# Patient Record
Sex: Male | Born: 1955 | Hispanic: No | Marital: Married | Smoking: Former smoker
Health system: Southern US, Community
[De-identification: ages and names within clinical notes are randomized; demographics above are authoritative.]

## PROBLEM LIST (undated history)

## (undated) DIAGNOSIS — E785 Hyperlipidemia, unspecified: Secondary | ICD-10-CM

## (undated) DIAGNOSIS — R05 Cough: Secondary | ICD-10-CM

## (undated) DIAGNOSIS — I10 Essential (primary) hypertension: Secondary | ICD-10-CM

## (undated) DIAGNOSIS — T7840XA Allergy, unspecified, initial encounter: Secondary | ICD-10-CM

## (undated) DIAGNOSIS — J40 Bronchitis, not specified as acute or chronic: Secondary | ICD-10-CM

## (undated) DIAGNOSIS — K648 Other hemorrhoids: Secondary | ICD-10-CM

## (undated) DIAGNOSIS — R059 Cough, unspecified: Secondary | ICD-10-CM

## (undated) DIAGNOSIS — N200 Calculus of kidney: Secondary | ICD-10-CM

## (undated) DIAGNOSIS — K219 Gastro-esophageal reflux disease without esophagitis: Secondary | ICD-10-CM

## (undated) DIAGNOSIS — J209 Acute bronchitis, unspecified: Secondary | ICD-10-CM

## (undated) DIAGNOSIS — D126 Benign neoplasm of colon, unspecified: Secondary | ICD-10-CM

## (undated) DIAGNOSIS — J189 Pneumonia, unspecified organism: Secondary | ICD-10-CM

## (undated) DIAGNOSIS — K5790 Diverticulosis of intestine, part unspecified, without perforation or abscess without bleeding: Secondary | ICD-10-CM

## (undated) DIAGNOSIS — Z973 Presence of spectacles and contact lenses: Secondary | ICD-10-CM

## (undated) HISTORY — DX: Gastro-esophageal reflux disease without esophagitis: K21.9

## (undated) HISTORY — DX: Acute bronchitis, unspecified: J20.9

## (undated) HISTORY — DX: Bronchitis, not specified as acute or chronic: J40

## (undated) HISTORY — DX: Presence of spectacles and contact lenses: Z97.3

## (undated) HISTORY — DX: Hyperlipidemia, unspecified: E78.5

## (undated) HISTORY — PX: LUNG REMOVAL, PARTIAL: SHX233

## (undated) HISTORY — DX: Pneumonia, unspecified organism: J18.9

## (undated) HISTORY — DX: Other hemorrhoids: K64.8

## (undated) HISTORY — DX: Cough: R05

## (undated) HISTORY — DX: Calculus of kidney: N20.0

## (undated) HISTORY — DX: Benign neoplasm of colon, unspecified: D12.6

## (undated) HISTORY — DX: Diverticulosis of intestine, part unspecified, without perforation or abscess without bleeding: K57.90

## (undated) HISTORY — DX: Allergy, unspecified, initial encounter: T78.40XA

## (undated) HISTORY — DX: Cough, unspecified: R05.9

---

## 1993-10-22 HISTORY — PX: LUNG REMOVAL, PARTIAL: SHX233

## 1998-09-01 ENCOUNTER — Ambulatory Visit (HOSPITAL_COMMUNITY): Admission: RE | Admit: 1998-09-01 | Discharge: 1998-09-01 | Payer: Self-pay | Admitting: Gastroenterology

## 1998-09-03 ENCOUNTER — Emergency Department (HOSPITAL_COMMUNITY): Admission: EM | Admit: 1998-09-03 | Discharge: 1998-09-03 | Payer: Self-pay

## 1999-08-10 ENCOUNTER — Emergency Department (HOSPITAL_COMMUNITY): Admission: EM | Admit: 1999-08-10 | Discharge: 1999-08-10 | Payer: Self-pay | Admitting: *Deleted

## 2003-08-11 ENCOUNTER — Emergency Department (HOSPITAL_COMMUNITY): Admission: EM | Admit: 2003-08-11 | Discharge: 2003-08-12 | Payer: Self-pay | Admitting: Emergency Medicine

## 2010-02-07 ENCOUNTER — Encounter: Admission: RE | Admit: 2010-02-07 | Discharge: 2010-02-07 | Payer: Self-pay | Admitting: Specialist

## 2016-02-23 ENCOUNTER — Encounter: Payer: Self-pay | Admitting: Pulmonary Disease

## 2016-02-23 ENCOUNTER — Ambulatory Visit (INDEPENDENT_AMBULATORY_CARE_PROVIDER_SITE_OTHER): Payer: BLUE CROSS/BLUE SHIELD | Admitting: Pulmonary Disease

## 2016-02-23 VITALS — BP 160/90 | HR 78 | Ht 64.0 in | Wt 163.8 lb

## 2016-02-23 DIAGNOSIS — R0789 Other chest pain: Secondary | ICD-10-CM | POA: Diagnosis not present

## 2016-02-23 DIAGNOSIS — R059 Cough, unspecified: Secondary | ICD-10-CM

## 2016-02-23 DIAGNOSIS — R05 Cough: Secondary | ICD-10-CM | POA: Diagnosis not present

## 2016-02-23 NOTE — Patient Instructions (Signed)
   Call me if your cough returns  We will do a breathing test at her next appointment  I will see back in 4-6 weeks or sooner if needed  TESTS ORDERED: 1. Full PFT at f/u appointment

## 2016-02-23 NOTE — Progress Notes (Signed)
Subjective:    Patient ID: Garrett Henry, male    DOB: [birth year], 61 y.o.   MRN: [id number]  HPI He reports he was seen in April by his PCP for persistent coughing. He reports previously he was diagnosed with pneumonia and was treated with antibiotics. He underwent left lung surgery in 1995. He had resection for a nodule. He reports acute bronchitis generally once yearly in the early Spring. He reports his cough has improved significantly. He reports an abnormal sensation and pain in his left chest that he describes as a pain in his left chest radiating to his back under his scapula. His cough is nonproductive. The cough & chest discomfort are precipitated by lifting heavy objection. Denies any wheezing. Denies any dyspnea. He denies any recent fever, chills, or sweats. Previously he was having fever and sweats in April. Denies any sinus congestion or pressure. No rashes or bruising.   Review of Systems No nausea, emesis or diarrhea. No reflux. No adenopathy in his neck, groin, or axilla. A pertinent 14 point review of systems is negative except as per the history of presenting illness.  Allergies  Allergen Reactions  . Amoxicillin     Pressure in face    No current outpatient prescriptions on file prior to visit.   No current facility-administered medications on file prior to visit.    Past Medical History  Diagnosis Date  . Acute bronchitis   . PNA (pneumonia)   . Cough   . Bronchitis     Past Surgical History  Procedure Laterality Date  . Left lobectomy      Family History  Problem Relation Age of Onset  . Stroke Father     Social History   Social History  . Marital Status: Single    Spouse Name: N/A  . Number of Children: N/A  . Years of Education: N/A   Social History Main Topics  . Smoking status: Former Smoker -- 0.50 packs/day for 20 years    Types: Cigarettes    Quit date: 10/22/1993  . Smokeless tobacco: None     Comment: smoked tobacco until 1992, then  started smoking cigerettes.  . Alcohol Use: No  . Drug Use: No  . Sexual Activity: Not Asked   Other Topics Concern  . None   Social History Narrative   Originally from [place]. He moved to [place] from [place] in 1992. Currently doesn't work and takes care of his granddaughter. No pets currently. No bird exposure. No mold exposure. Previously did live in the [place] in [place].       Objective:   Physical Exam BP 160/90 mmHg  Pulse 78  Ht 5\' 4"  (1.626 m)  Wt 163 lb 12.8 oz (74.299 kg)  BMI 28.10 kg/m2  SpO2 97% General:  Awake. Alert. No acute distress.  Integument:  Warm & dry. No rash on exposed skin. No bruising. Tattoo noted on left forearm. Lymphatics:  No appreciated cervical or supraclavicular lymphadenoapthy. HEENT:  Moist mucus membranes. No oral ulcers. No scleral injection or icterus. Moderate bilateral nasal turbinate swelling. Cardiovascular:  Regular rate. No edema. No appreciable JVD.  Pulmonary:  Good aeration & clear to auscultation bilaterally. Symmetric chest wall expansion. No accessory muscle use. Abdomen: Soft. Normal bowel sounds. Nondistended. Grossly nontender. Musculoskeletal:  Normal bulk and tone. Hand grip strength 5/5 bilaterally. No joint deformity or effusion appreciated. Neurological:  CN 2-12 grossly in tact. No meningismus. Moving all 4 extremities equally. Symmetric brachioradialis deep tendon reflexes. Psychiatric:  Mood and affect congruent. Speech normal rhythm, rate & tone.   IMAGING CXR PA/LAT 02/07/10 (personally reviewed by me):  Linear opacity left upper lobe consistent with scar formation. Flattening of the left hemidiaphragm. No other focal opacity appreciated. No pleural effusion. Heart normal in size. Mediastinum normal in contour.    Assessment & Plan:  60 year old Asian male with history of left lung resection for nodule in 1995. Patient does have a history of tobacco use as well. Seems to have atypical pain that is more of a pleuritic  nature and given the left side predominance represents previous scar tissue formation. As to why this is affecting him more now so many years after his original surgery is unclear. We did discuss performing CT imaging of the chest to get a better evaluation of his lung and surrounding tissues but he feels he is not in a financial position at this time to be able to afford the test. Given his history of coughing I do feel it's reasonable to evaluate him for possible underlying COPD. I instructed the patient to contact my office if his cough returns or he develops any new difficulties with his breathing before his next appointment.  1. Cough: Checking full pulmonary function testing 2. Atypical chest pain: Patient declined CT imaging of the chest at this time. 3. Follow-up: Patient to return to clinic in 4-6 weeks or sooner if needed.  Sonia Baller Ashok Cordia, M.D. [facility] Pulmonary & Critical Care Pager:  [phone] After 3pm or if no response, call [phone] 4:00 PM 02/23/2016

## 2016-05-23 ENCOUNTER — Ambulatory Visit (INDEPENDENT_AMBULATORY_CARE_PROVIDER_SITE_OTHER): Payer: BLUE CROSS/BLUE SHIELD | Admitting: Pulmonary Disease

## 2016-05-23 ENCOUNTER — Encounter: Payer: Self-pay | Admitting: Pulmonary Disease

## 2016-05-23 VITALS — BP 132/74 | HR 56 | Ht 64.75 in | Wt 161.0 lb

## 2016-05-23 DIAGNOSIS — J984 Other disorders of lung: Secondary | ICD-10-CM | POA: Diagnosis not present

## 2016-05-23 DIAGNOSIS — K219 Gastro-esophageal reflux disease without esophagitis: Secondary | ICD-10-CM | POA: Insufficient documentation

## 2016-05-23 DIAGNOSIS — R059 Cough, unspecified: Secondary | ICD-10-CM

## 2016-05-23 DIAGNOSIS — R05 Cough: Secondary | ICD-10-CM | POA: Diagnosis not present

## 2016-05-23 DIAGNOSIS — R0789 Other chest pain: Secondary | ICD-10-CM | POA: Diagnosis not present

## 2016-05-23 LAB — PULMONARY FUNCTION TEST
DL/VA % pred: 170 %
DL/VA: 7.28 ml/min/mmHg/L
DLCO cor % pred: 111 %
DLCO cor: 28.16 ml/min/mmHg
DLCO unc % pred: 107 %
DLCO unc: 27.24 ml/min/mmHg
FEF 25-75 Post: 2.19 L/sec
FEF 25-75 Pre: 1.88 L/sec
FEF2575-%Change-Post: 16 %
FEF2575-%Pred-Post: 86 %
FEF2575-%Pred-Pre: 74 %
FEV1-%Change-Post: 3 %
FEV1-%Pred-Post: 83 %
FEV1-%Pred-Pre: 80 %
FEV1-Post: 2.37 L
FEV1-Pre: 2.29 L
FEV1FVC-%Change-Post: 2 %
FEV1FVC-%Pred-Pre: 97 %
FEV6-%Change-Post: 1 %
FEV6-Post: 2.98 L
FEV6-Pre: 2.95 L
FEV6FVC-%Change-Post: 0 %
FVC-%Change-Post: 0 %
FVC-%Pred-Post: 83 %
FVC-%Pred-Pre: 83 %
FVC-Post: 2.98 L
FVC-Pre: 2.96 L
Post FEV1/FVC ratio: 79 %
Post FEV6/FVC ratio: 100 %
Pre FEV1/FVC ratio: 77 %
Pre FEV6/FVC Ratio: 100 %
RV % pred: 57 %
RV: 1.12 L
TLC % pred: 68 %
TLC: 4.04 L

## 2016-05-23 NOTE — Progress Notes (Signed)
Subjective:    Patient ID: Garrett Henry, male    DOB: 1956-05-15, 60 y.o.   MRN: [id number]  C.C.:  Follow-up for Cough & Atypical Chest Pain.  HPI Cough:  He reports his cough has resolved. Denies any dyspnea.   Aypyical Chest Pain:  Patient declined CT scan at last appointment. He reports his pain is intermittent and he feels pain with continuing to lift heavy objects. He reports the pain is in his right chest radiating to his back.  Review of Systems he reports about 2 nights ago he did have sweats. No other subjective fever or chills. He has had some ongoing abdominal discomfort. Denies any nausea or emesis. Does have reflux & dyspepsia with spicy foods, pizza, & soda. No rashes or bruising.   Allergies  Allergen Reactions  . Amoxicillin     Pressure in face    No current outpatient prescriptions on file prior to visit.   No current facility-administered medications on file prior to visit.     Past Medical History:  Diagnosis Date  . Acute bronchitis   . Bronchitis   . Cough   . PNA (pneumonia)     Past Surgical History:  Procedure Laterality Date  . left lobectomy      Family History  Problem Relation Age of Onset  . Stroke Father     Social History   Social History  . Marital status: Single    Spouse name: N/A  . Number of children: N/A  . Years of education: N/A   Social History Main Topics  . Smoking status: Former Smoker    Packs/day: 0.50    Years: 20.00    Types: Cigarettes    Quit date: 10/22/1993  . Smokeless tobacco: None     Comment: smoked tobacco until 1992, then started smoking cigerettes.  . Alcohol use No  . Drug use: No  . Sexual activity: Not Asked   Other Topics Concern  . None   Social History Narrative   Originally from [place]. He moved to [place] from [place] in 1992. Currently doesn't work and takes care of his granddaughter. No pets currently. No bird exposure. No mold exposure. Previously did live in the jungle in [place].      Objective:   Physical Exam BP 132/74 (BP Location: Left Arm, Cuff Size: Normal)   Pulse (!) 56   Ht 5' 4.75" (1.645 m)   Wt 161 lb (73 kg)   SpO2 99%   BMI 27.00 kg/m  General:  Awake. No distress. Appears comfortable. Integument:  Warm & dry. No rash on exposed skin. Tattoo noted on left forearm. Lymphatics:  No appreciated cervical or supraclavicular lymphadenoapthy. HEENT:  Moist mucus membranes. No oral ulcers. Minimal nasal turbinate swelling. Cardiovascular:  Regular rate. No edema. Normal S1 & S2.  Pulmonary:  Normal work of breathing on room air. Clear bilaterally to auscultation.Good aeration bilaterally.  Abdomen: Soft. Normal bowel sounds. Nontender. Musculoskeletal: No chest wall deformity. No palpation of anterior or posterior chest.  PFT 05/23/10: FVC 2.96 L (83%) FEV1 2.29 L (80%) FEV1/FVC 0.77 FEF 25-75 1.88 L (74%) no bronchodilator response TLC 4.04 L (68%) RV 57% ERV 120% DLCO corrected 111% (Hgb 13.5)  IMAGING CXR PA/LAT 02/07/10 (previously reviewed by me):  Linear opacity left upper lobe consistent with scar formation. Flattening of the left hemidiaphragm. No other focal opacity appreciated. No pleural effusion. Heart normal in size. Mediastinum normal in contour.    Assessment & Plan:  60  y.o. Asian male with history of left lung resection for nodule in 1995. Mild restriction noted on lung volumes today with normal spirometry and carbon monoxide diffusion capacity is likely due to his prior left lung resection. I did spend a significant amount time today counseling the patient on the high potential for a new lung malignancy given his previous history of tobacco use. However, the patient declines chest imaging at this time. He prefers to contact my office if he decides to undergo any chest imaging in the future. I instructed him to contact me if he had any further questions or concerns or if I can be of any further assistance to him.  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 Patient declines further chest imaging at this time.  2. Atypical Chest Pain: Patient declines further chest imaging at this time. 3. Follow-up: Patient will return to clinic on an as-needed basis.   Sonia Baller Ashok Cordia, M.D. [facility] Pulmonary & Critical Care Pager:  [phone] After 3pm or if no response, call [phone] 11:37 AM 05/23/16

## 2016-05-23 NOTE — Patient Instructions (Signed)
   Call me if your pain doesn't go away or you decide you want to get an X-ray of your chest.  Just call my office to set up an appointment if you need to be seen again.

## 2016-05-23 NOTE — Progress Notes (Signed)
Test reviewed.  

## 2016-11-28 ENCOUNTER — Encounter: Payer: Self-pay | Admitting: Pulmonary Disease

## 2016-11-28 ENCOUNTER — Ambulatory Visit (INDEPENDENT_AMBULATORY_CARE_PROVIDER_SITE_OTHER): Payer: BLUE CROSS/BLUE SHIELD | Admitting: Pulmonary Disease

## 2016-11-28 ENCOUNTER — Ambulatory Visit: Payer: BLUE CROSS/BLUE SHIELD | Admitting: Pulmonary Disease

## 2016-11-28 ENCOUNTER — Ambulatory Visit (INDEPENDENT_AMBULATORY_CARE_PROVIDER_SITE_OTHER)
Admission: RE | Admit: 2016-11-28 | Discharge: 2016-11-28 | Disposition: A | Discharge status: Home / Self Care | Payer: BLUE CROSS/BLUE SHIELD | Source: Ambulatory Visit | Attending: Pulmonary Disease | Admitting: Pulmonary Disease

## 2016-11-28 VITALS — BP 130/82 | HR 69 | Ht 64.75 in | Wt 166.0 lb

## 2016-11-28 DIAGNOSIS — R0789 Other chest pain: Secondary | ICD-10-CM | POA: Diagnosis not present

## 2016-11-28 DIAGNOSIS — R05 Cough: Secondary | ICD-10-CM

## 2016-11-28 DIAGNOSIS — R059 Cough, unspecified: Secondary | ICD-10-CM

## 2016-11-28 MED ORDER — PREDNISONE 10 MG PO TABS: 60.0000 mg | ORAL_TABLET | Freq: Every day | ORAL | 0 refills | Status: AC

## 2016-11-28 NOTE — Progress Notes (Signed)
Subjective:    Patient ID: Garrett Henry, male    DOB: 23-Aug-1956, 61 y.o.   MRN: [id number]  C.C.:  Follow-up for Cough & Atypical Chest Pain.  HPI Cough:  Cough previously had resolved. He reports he still has mild dyspnea. He now reports his cough is only with pain.   Aypyical Chest Pain:  Patient previously underwent lung resection. Previously declined further imaging. Previously described pain with lifting heavy objects and with right chest radiating to his back being the primary descriptors. He still has pain with lifting objects. Denies any pleurisy.  Review of Systems  He reports a subjective fever with his pain. No other chills or sweats. No abdominal pain or nausea. No rashes or bruising.   Allergies  Allergen Reactions  . Amoxicillin     Pressure in face    No current outpatient prescriptions on file prior to visit.   No current facility-administered medications on file prior to visit.     Past Medical History:  Diagnosis Date  . Acute bronchitis   . Bronchitis   . Cough   . PNA (pneumonia)     Past Surgical History:  Procedure Laterality Date  . left lobectomy      Family History  Problem Relation Age of Onset  . Stroke Father     Social History   Social History  . Marital status: Single    Spouse name: N/A  . Number of children: N/A  . Years of education: N/A   Social History Main Topics  . Smoking status: Former Smoker    Packs/day: 0.50    Years: 20.00    Types: Cigarettes    Quit date: 10/22/1993  . Smokeless tobacco: Never Used     Comment: smoked tobacco until 1992, then started smoking cigerettes.  . Alcohol use No  . Drug use: No  . Sexual activity: Not Asked   Other Topics Concern  . None   Social History Narrative   Originally from [place]. He moved to [place] from [place] in 1992. Currently doesn't work and takes care of his granddaughter. No pets currently. No bird exposure. No mold exposure. Previously did live in the jungle in  [place].       Objective:   Physical Exam BP 130/82 (BP Location: Right Arm, Patient Position: Sitting, Cuff Size: Normal)   Pulse 69   Ht 5' 4.75" (1.645 m)   Wt 166 lb (75.3 kg)   SpO2 95%   BMI 27.84 kg/m   General:  Awake. Alert. No acute distress.  Integument:  Warm & dry. No rash on exposed skin.  Extremities:  No cyanosis or clubbing.  HEENT:  Moist mucus membranes. No oral ulcers. No scleral injection or icterus.  Cardiovascular:  Regular rate. No edema. S1 & S2.  Pulmonary:  Good aeration & clear to auscultation bilaterally. Symmetric chest wall expansion. No accessory muscle use on room air. Abdomen: Soft. Normal bowel sounds. Nondistended.  Musculoskeletal:  Normal bulk and tone. No joint deformity or effusion appreciated. No pain with palpation of his chest. No chest wall deformity.  PFT 05/23/10: FVC 2.96 L (83%) FEV1 2.29 L (80%) FEV1/FVC 0.77 FEF 25-75 1.88 L (74%) no bronchodilator response TLC 4.04 L (68%) RV 57% ERV 120% DLCO corrected 111% (Hgb 13.5)  IMAGING CXR PA/LAT 02/07/10 (previously reviewed by me):  Linear opacity left upper lobe consistent with scar formation. Flattening of the left hemidiaphragm. No other focal opacity appreciated. No pleural effusion. Heart normal in size.  Mediastinum normal in contour.    Assessment & Plan:  61 y.o. Asian male with known history of atypical chest pain and restrictive lung disease likely due to prior resection. He continues to have an intermittent nonproductive cough. He also is continuing to have atypical chest pain which is likely due to his previous lung resection. Given the potential for symptomatic relief from anti-inflammatory medications in the past I believe trying prednisone is reasonable. I instructed the patient, office if he had any new breathing problems or questions before his next appointment.  1. Cough: Treating with prednisone 60 mg daily 5 days. Checking chest x-ray PA/LAT today. 2. Atypical Chest Pain:  Possibly secondary to previous lung resection. Checking CT chest without contrast as soon as possible.  3. Follow-up:  Return to clinic in 3 weeks or sooner if needed.   Sonia Baller Ashok Cordia, M.D. [facility] Pulmonary & Critical Care Pager:  [phone] After 3pm or if no response, call [phone] 12:16 PM 11/28/16

## 2016-11-28 NOTE — Patient Instructions (Addendum)
   We will review your test results from your X-ray and CT at your next appointment.  Let me know if your pain doesn't get better with the Prednisone I'm prescribing today.  I will see you back in 3 weeks.  TESTS ORDERED: 1. CXR PA/LAT TODAY 2. CT CHEST W/O ASAP

## 2016-12-05 ENCOUNTER — Ambulatory Visit (INDEPENDENT_AMBULATORY_CARE_PROVIDER_SITE_OTHER)
Admission: RE | Admit: 2016-12-05 | Discharge: 2016-12-05 | Disposition: A | Discharge status: Home / Self Care | Payer: BLUE CROSS/BLUE SHIELD | Source: Ambulatory Visit | Attending: Pulmonary Disease | Admitting: Pulmonary Disease

## 2016-12-05 DIAGNOSIS — R05 Cough: Secondary | ICD-10-CM | POA: Diagnosis not present

## 2016-12-05 DIAGNOSIS — R059 Cough, unspecified: Secondary | ICD-10-CM

## 2016-12-14 ENCOUNTER — Telehealth: Payer: Self-pay | Admitting: Pulmonary Disease

## 2016-12-17 NOTE — Telephone Encounter (Signed)
Notes Recorded by Javier Glazier, MD on 12/14/2016 at 2:30 PM EST Please let the patient know I reviewed his chest CT scan. There is some scar tissue surrounding his left upper lung from his previous surgery. But no other cause for his symptoms. Thank you. ------------------------------------------- Spoke with pt. He is aware of results. Nothing further was needed.

## 2016-12-24 ENCOUNTER — Encounter: Payer: Self-pay | Admitting: Pulmonary Disease

## 2016-12-24 ENCOUNTER — Ambulatory Visit (INDEPENDENT_AMBULATORY_CARE_PROVIDER_SITE_OTHER): Payer: BLUE CROSS/BLUE SHIELD | Admitting: Pulmonary Disease

## 2016-12-24 ENCOUNTER — Ambulatory Visit: Payer: BLUE CROSS/BLUE SHIELD | Admitting: Pulmonary Disease

## 2016-12-24 VITALS — BP 130/72 | HR 68 | Ht 64.75 in | Wt 165.0 lb

## 2016-12-24 DIAGNOSIS — R05 Cough: Secondary | ICD-10-CM

## 2016-12-24 DIAGNOSIS — R059 Cough, unspecified: Secondary | ICD-10-CM

## 2016-12-24 DIAGNOSIS — R0789 Other chest pain: Secondary | ICD-10-CM | POA: Diagnosis not present

## 2016-12-24 NOTE — Progress Notes (Signed)
Subjective:    Patient ID: Garrett Henry, male    DOB: 04/12/1956, 61 y.o.   MRN: [id number]  C.C.:  Follow-up for Cough & Atypical Chest Pain.  HPI Cough: Patient given prednisone prescription at last appointment but stopped abruptly after only one tablet with increased cough and dyspnea. Cough is still with exertion. But overall cough is significantly improved.  Atypical chest pain: He reports he no more chest pain or pressure. Reports only minimal discomfort depending upon lifting and torsion of torso.  Review of Systems  He denies any dyspnea at this moment. He continues to have dyspnea on exertion. No subjective fever or chills. No abdominal pain or nausea.  Allergies  Allergen Reactions  . Amoxicillin     Pressure in face    No current outpatient prescriptions on file prior to visit.   No current facility-administered medications on file prior to visit.     Past Medical History:  Diagnosis Date  . Acute bronchitis   . Bronchitis   . Cough   . PNA (pneumonia)     Past Surgical History:  Procedure Laterality Date  . left lobectomy      Family History  Problem Relation Age of Onset  . Stroke Father     Social History   Social History  . Marital status: Single    Spouse name: N/A  . Number of children: N/A  . Years of education: N/A   Social History Main Topics  . Smoking status: Former Smoker    Packs/day: 0.50    Years: 20.00    Types: Cigarettes    Quit date: 10/22/1993  . Smokeless tobacco: Never Used     Comment: smoked tobacco until 1992, then started smoking cigerettes.  . Alcohol use No  . Drug use: No  . Sexual activity: Not Asked   Other Topics Concern  . None   Social History Narrative   Originally from [place]. He moved to [place] from [place] in 1992. Currently doesn't work and takes care of his granddaughter. No pets currently. No bird exposure. No mold exposure. Previously did live in the jungle in [place].       Objective:   Physical  Exam BP 130/72 (BP Location: Left Arm, Patient Position: Sitting, Cuff Size: Large)   Pulse 68   Ht 5' 4.75" (1.645 m)   Wt 165 lb (74.8 kg)   SpO2 97%   BMI 27.67 kg/m   Gen.: Awake. No distress. Alert. Integument: Nondiaphoretic. No rash or bruising on exposed skin. Extremities: No cyanosis or clubbing. HEENT: Moist mucous membranes. No scleral icterus or injection. Pulmonary: Normal work of breathing on room air. Speaking in complete sentences. Musculoskeletal: Normal bulk and tone. No joint deformity or effusion appreciated.  PFT 05/23/10: FVC 2.96 L (83%) FEV1 2.29 L (80%) FEV1/FVC 0.77 FEF 25-75 1.88 L (74%) no bronchodilator response TLC 4.04 L (68%) RV 57% ERV 120% DLCO corrected 111% (Hgb 13.5)  IMAGING CT CHEST W/O 12/05/16 (personally reviewed by me):  Postoperative scar tissue formation in the left upper lobe unchanged. Calcified pleural plaque at the right lung base. No other new parenchymal opacity or mass appreciated. No pleural effusion. No pathologic mediastinal adenopathy. No pericardial effusion.  CXR PA/LAT 02/07/10 (previously reviewed by me):  Linear opacity left upper lobe consistent with scar formation. Flattening of the left hemidiaphragm. No other focal opacity appreciated. No pleural effusion. Heart normal in size. Mediastinum normal in contour.    Assessment & Plan:  61 y.o.  male with known history of atypical chest pain and cough. Has restrictive lung disease secondary to his prior lung resection and scar tissue formation.  I reviewed his chest CT scan with him today which does show scar tissue formation in his left upper lung as well as his left pleura which is likely due to his prior lung resection. With improvement in his cough and atypical chest pain I do not feel further medications are necessary but did urge him to follow-up with his primary care physician as he was previously prescribed a medication which significantly helped, again should this recur. I  explained to the patient that the scar tissue formation was from his previous surgery and to be expected. I instructed the patient to contact my office if he had any new breathing problems or questions as I would be happy to see him back.  1. Cough: Resolving. Likely due to scar tissue formation. Recommended obtaining prescription from his PCP which previously help to completely resolve his cough. 2. Atypical chest pain: Resolving. No new medications. 3. Follow-up: Return to clinic as needed.  Sonia Baller Ashok Cordia, M.D. [facility] Pulmonary & Critical Care Pager:  [phone] After 3pm or if no response, call [phone] 4:34 PM 12/24/16

## 2016-12-24 NOTE — Patient Instructions (Signed)
   Call me if you have any new breathing problems or questions.  I will see you back as needed.  

## 2018-03-12 ENCOUNTER — Encounter: Payer: Self-pay | Admitting: Internal Medicine

## 2018-04-25 ENCOUNTER — Encounter: Payer: Self-pay | Admitting: *Deleted

## 2018-05-19 ENCOUNTER — Encounter (INDEPENDENT_AMBULATORY_CARE_PROVIDER_SITE_OTHER): Payer: Self-pay

## 2018-05-19 ENCOUNTER — Other Ambulatory Visit (INDEPENDENT_AMBULATORY_CARE_PROVIDER_SITE_OTHER): Payer: BLUE CROSS/BLUE SHIELD

## 2018-05-19 ENCOUNTER — Encounter: Payer: Self-pay | Admitting: Internal Medicine

## 2018-05-19 ENCOUNTER — Ambulatory Visit: Payer: BLUE CROSS/BLUE SHIELD | Admitting: Internal Medicine

## 2018-05-19 VITALS — BP 136/66 | HR 72 | Ht 64.0 in | Wt 165.1 lb

## 2018-05-19 DIAGNOSIS — R150 Incomplete defecation: Secondary | ICD-10-CM

## 2018-05-19 DIAGNOSIS — K5900 Constipation, unspecified: Secondary | ICD-10-CM | POA: Diagnosis not present

## 2018-05-19 DIAGNOSIS — Z1211 Encounter for screening for malignant neoplasm of colon: Secondary | ICD-10-CM

## 2018-05-19 LAB — CBC WITH DIFFERENTIAL/PLATELET
Basophils Absolute: 0 10*3/uL (ref 0.0–0.1)
Basophils Relative: 0.3 % (ref 0.0–3.0)
EOS ABS: 0.1 10*3/uL (ref 0.0–0.7)
EOS PCT: 0.8 % (ref 0.0–5.0)
HCT: 47.4 % (ref 39.0–52.0)
Hemoglobin: 15 g/dL (ref 13.0–17.0)
LYMPHS PCT: 27.2 % (ref 12.0–46.0)
Lymphs Abs: 2 10*3/uL (ref 0.7–4.0)
MCHC: 31.8 g/dL (ref 30.0–36.0)
MCV: 77.2 fl — ABNORMAL LOW (ref 78.0–100.0)
MONO ABS: 0.7 10*3/uL (ref 0.1–1.0)
Monocytes Relative: 9.1 % (ref 3.0–12.0)
Neutro Abs: 4.7 10*3/uL (ref 1.4–7.7)
Neutrophils Relative %: 62.6 % (ref 43.0–77.0)
Platelets: 163 10*3/uL (ref 150.0–400.0)
RBC: 6.14 Mil/uL — AB (ref 4.22–5.81)
RDW: 13.3 % (ref 11.5–15.5)
WBC: 7.5 10*3/uL (ref 4.0–10.5)

## 2018-05-19 LAB — FERRITIN: FERRITIN: 374.6 ng/mL — AB (ref 22.0–322.0)

## 2018-05-19 MED ORDER — SUPREP BOWEL PREP KIT 17.5-3.13-1.6 GM/177ML PO SOLN: 1.0000 | ORAL | 0 refills | Status: DC

## 2018-05-19 NOTE — Patient Instructions (Signed)
You have been scheduled for a colonoscopy. Please follow written instructions given to you at your visit today.  Please pick up your prep supplies at the pharmacy within the next 1-3 days. If you use inhalers (even only as needed), please bring them with you on the day of your procedure. Your physician has requested that you go to www.startemmi.com and enter the access code given to you at your visit today. This web site gives a general overview about your procedure. However, you should still follow specific instructions given to you by our office regarding your preparation for the procedure.  Your provider has requested that you go to the basement level for lab work before leaving today. Press "B" on the elevator. The lab is located at the first door on the left as you exit the elevator.  If you are age 37 or older, your body mass index should be between 23-30. Your Body mass index is 28.34 kg/m. If this is out of the aforementioned range listed, please consider follow up with your Primary Care Provider.  If you are age 64 or younger, your body mass index should be between 19-25. Your Body mass index is 28.34 kg/m. If this is out of the aformentioned range listed, please consider follow up with your Primary Care Provider.

## 2018-05-19 NOTE — Progress Notes (Signed)
Patient ID: Garrett Henry, male   DOB: 03-Nov-1955, 62 y.o.   MRN: [id number] HPI: Garrett Henry is a 62 year old male seen in consultation at the request of Leonides Grills, Jr., PA-C to evaluate constipation and also for colon cancer screening.  He is here alone today.  He reports that over the last several months he has noticed more difficulty with bowel movements.  Stool can be hard and difficult at times to pass.  He feels incomplete defecation.  He states most often he will have a normal stool in the morning but then after that have the urge for more bowel movement but be unable to produce any more stool.  Feels like bowels are incompletely evacuated.  Can be associated with lower back pain.  Has not seen blood in his stool or melena.  At times feels a burning abdominal pain which seems to be better with ginger.  He has not lost weight.  In the past was told he may have acid reflux and try to medication at night but did not find it helpful.  He denies dysphagia and odynophagia.  Does not know family medical history as he is from [place] and has been separated from his family for many years.  On review of records it appears that he may have been prescribed oral iron.  He is not taking any medications currently.  He has never had a colonoscopy  Past Medical History:  Diagnosis Date  . Acute bronchitis   . Bronchitis   . Cough   . PNA (pneumonia)     Past Surgical History:  Procedure Laterality Date  . LUNG REMOVAL, PARTIAL Left     No outpatient medications prior to visit.   No facility-administered medications prior to visit.     Allergies  Allergen Reactions  . Amoxicillin     Pressure in face    Family History  Problem Relation Age of Onset  . Stroke Father     Social History   Tobacco Use  . Smoking status: Former Smoker    Packs/day: 0.50    Years: 20.00    Pack years: 10.00    Types: Cigarettes    Last attempt to quit: 10/22/1993    Years since quitting: 24.5  .  Smokeless tobacco: Never Used  . Tobacco comment: smoked tobacco until 1992, then started smoking cigerettes.  Substance Use Topics  . Alcohol use: No    Alcohol/week: 0.0 oz  . Drug use: No    ROS: As per history of present illness, otherwise negative  BP 136/66 (BP Location: Left Arm, Patient Position: Sitting, Cuff Size: Normal)   Pulse 72   Ht 5\' 4"  (1.626 m) Comment: height measured without shoes  Wt 165 lb 2 oz (74.9 kg)   BMI 28.34 kg/m  Constitutional: Well-developed and well-nourished. No distress. HEENT: Normocephalic and atraumatic. Conjunctivae are normal.  No scleral icterus. Neck: Neck supple. Trachea midline. Cardiovascular: Normal rate, regular rhythm and intact distal pulses. No M/R/G Pulmonary/chest: Effort normal and breath sounds normal. No wheezing, rales or rhonchi. Abdominal: Soft, nontender, nondistended. Bowel sounds active throughout. There are no masses palpable. No hepatosplenomegaly. Extremities: no clubbing, cyanosis, or edema Neurological: Alert and oriented to person place and time. Skin: Skin is warm and dry.  Psychiatric: Normal mood and affect. Behavior is normal.  ASSESSMENT/PLAN: 62 year old male seen in consultation at the request of Leonides Grills, Jr., PA-C to evaluate constipation and also for colon cancer screening.    1.  Colon cancer  screening --I recommended we proceed to colonoscopy for colon cancer screening.  This will be his initial colonoscopy.  We discussed the risk, benefits and alternatives and he is agreeable and wishes to proceed.  2.  Constipation/incomplete evacuation --I am proceeding with colonoscopy as above.   if normal we can try a laxative such as MiraLAX.  We will address this issue after colonoscopy.  3.  Question iron deficiency --noted in his chart though I do not see prior labs in our system.  I am going to check a CBC and ferritin today.    TD:VVOH, Side M., Pa-c [address] [place], [place] [address]

## 2018-06-24 ENCOUNTER — Encounter: Payer: Self-pay | Admitting: Internal Medicine

## 2018-07-07 ENCOUNTER — Ambulatory Visit (AMBULATORY_SURGERY_CENTER): Payer: BLUE CROSS/BLUE SHIELD | Admitting: Internal Medicine

## 2018-07-07 ENCOUNTER — Encounter: Payer: Self-pay | Admitting: Internal Medicine

## 2018-07-07 VITALS — BP 121/64 | HR 74 | Temp 98.6°F | Resp 29 | Ht 64.0 in | Wt 165.0 lb

## 2018-07-07 DIAGNOSIS — D123 Benign neoplasm of transverse colon: Secondary | ICD-10-CM

## 2018-07-07 DIAGNOSIS — Z1211 Encounter for screening for malignant neoplasm of colon: Secondary | ICD-10-CM

## 2018-07-07 MED ORDER — SODIUM CHLORIDE 0.9 % IV SOLN: 500.0000 mL | Freq: Once | INTRAVENOUS | Status: DC

## 2018-07-07 NOTE — Op Note (Signed)
[facility] Patient Name: Garrett Henry Procedure Date: 07/07/2018 2:50 PM MRN: [id number] Endoscopist: Jerene Bears , MD Age: 62 Referring MD:  Date of Birth: 10-16-1956 Gender: Male Account #: [id number] Procedure:                Colonoscopy Indications:              Screening for colorectal malignant neoplasm, This                            is the patient's first colonoscopy Medicines:                Monitored Anesthesia Care Procedure:                Pre-Anesthesia Assessment:                           - Prior to the procedure, a History and Physical                            was performed, and patient medications and                            allergies were reviewed. The patient's tolerance of                            previous anesthesia was also reviewed. The risks                            and benefits of the procedure and the sedation                            options and risks were discussed with the patient.                            All questions were answered, and informed consent                            was obtained. Prior Anticoagulants: The patient has                            taken no previous anticoagulant or antiplatelet                            agents. ASA Grade Assessment: II - A patient with                            mild systemic disease. After reviewing the risks                            and benefits, the patient was deemed in                            satisfactory condition to undergo the procedure.  After obtaining informed consent, the colonoscope                            was passed under direct vision. Throughout the                            procedure, the patient's blood pressure, pulse, and                            oxygen saturations were monitored continuously. The                            Colonoscope was introduced through the anus and                            advanced to the cecum, identified  by appendiceal                            orifice and ileocecal valve. The colonoscopy was                            performed without difficulty. The patient tolerated                            the procedure well. The quality of the bowel                            preparation was good. The ileocecal valve,                            appendiceal orifice, and rectum were photographed. Scope In: 3:01:47 PM Scope Out: 3:11:27 PM Scope Withdrawal Time: 0 hours 8 minutes 27 seconds  Total Procedure Duration: 0 hours 9 minutes 40 seconds  Findings:                 The digital rectal exam was normal.                           Two sessile polyps were found in the transverse                            colon. The polyps were 4 to 5 mm in size. These                            polyps were removed with a cold snare. Resection                            and retrieval were complete.                           Scattered small-mouthed diverticula were found in                            the sigmoid colon and descending colon.  Internal hemorrhoids were found during                            retroflexion. The hemorrhoids were small.                           The exam was otherwise without abnormality. Complications:            No immediate complications. Estimated Blood Loss:     Estimated blood loss was minimal. Impression:               - Two 4 to 5 mm polyps in the transverse colon,                            removed with a cold snare. Resected and retrieved.                           - Diverticulosis in the sigmoid colon and in the                            descending colon.                           - Small internal hemorrhoids.                           - The examination was otherwise normal. Recommendation:           - Patient has a contact number available for                            emergencies. The signs and symptoms of potential                             delayed complications were discussed with the                            patient. Return to normal activities tomorrow.                            Written discharge instructions were provided to the                            patient.                           - Resume previous diet.                           - Continue present medications.                           - For acid reflux you can use famotidine/Pepcid 20                            mg twice daily as needed. If this fails to improve  symptoms or if your symptoms are frequent or should                            you develop other issues such as trouble                            swallowing, please contact my office for a                            follow-up visit.                           - Await pathology results.                           - Repeat colonoscopy is recommended. The                            colonoscopy date will be determined after pathology                            results from today's exam become available for                            review. Jerene Bears, MD 07/07/2018 3:16:18 PM This report has been signed electronically.

## 2018-07-07 NOTE — Patient Instructions (Addendum)
**   Handouts given on polyps, diverticulosis, and hemorrhoids (You can use Preparation H with Hydrocortisone cream to help with hemorrhoids.)  **   YOU HAD AN ENDOSCOPIC PROCEDURE TODAY AT [facility]:   Refer to the procedure report that was given to you for any specific questions about what was found during the examination.  If the procedure report does not answer your questions, please call your gastroenterologist to clarify.  If you requested that your care partner not be given the details of your procedure findings, then the procedure report has been included in a sealed envelope for you to review at your convenience later.  YOU SHOULD EXPECT: Some feelings of bloating in the abdomen. Passage of more gas than usual.  Walking can help get rid of the air that was put into your GI tract during the procedure and reduce the bloating. If you had a lower endoscopy (such as a colonoscopy or flexible sigmoidoscopy) you may notice spotting of blood in your stool or on the toilet paper. If you underwent a bowel prep for your procedure, you may not have a normal bowel movement for a few days.  Please Note:  You might notice some irritation and congestion in your nose or some drainage.  This is from the oxygen used during your procedure.  There is no need for concern and it should clear up in a day or so.  SYMPTOMS TO REPORT IMMEDIATELY:   Following lower endoscopy (colonoscopy or flexible sigmoidoscopy):  Excessive amounts of blood in the stool  Significant tenderness or worsening of abdominal pains  Swelling of the abdomen that is new, acute  Fever of 100F or higher  For urgent or emergent issues, a gastroenterologist can be reached at any hour by calling [phone].   DIET:  We do recommend a small meal at first, but then you may proceed to your regular diet.  Drink plenty of fluids but you should avoid alcoholic beverages for 24 hours.  ACTIVITY:  You should plan to take it  easy for the rest of today and you should NOT DRIVE or use heavy machinery until tomorrow (because of the sedation medicines used during the test).    FOLLOW UP: Our staff will call the number listed on your records the next business day following your procedure to check on you and address any questions or concerns that you may have regarding the information given to you following your procedure. If we do not reach you, we will leave a message.  However, if you are feeling well and you are not experiencing any problems, there is no need to return our call.  We will assume that you have returned to your regular daily activities without incident.  If any biopsies were taken you will be contacted by phone or by letter within the next 1-3 weeks.  Please call us at [phone] if you have not heard about the biopsies in 3 weeks.    SIGNATURES/CONFIDENTIALITY: You and/or your care partner have signed paperwork which will be entered into your electronic medical record.  These signatures attest to the fact that that the information above on your After Visit Summary has been reviewed and is understood.  Full responsibility of the confidentiality of this discharge information lies with you and/or your care-partner.

## 2018-07-07 NOTE — Progress Notes (Signed)
Called to room to assist during endoscopic procedure.  Patient ID and intended procedure confirmed with present staff. Received instructions for my participation in the procedure from the performing physician.  

## 2018-07-07 NOTE — Progress Notes (Signed)
Report given to PACU, vss 

## 2018-07-08 ENCOUNTER — Telehealth: Payer: Self-pay | Admitting: *Deleted

## 2018-07-08 NOTE — Telephone Encounter (Signed)
  Follow up Call-  Call back number 07/07/2018  Post procedure Call Back phone  # [phone]  Permission to leave phone message Yes  Some recent data might be hidden     Patient questions:  Do you have a fever, pain , or abdominal swelling? No. Pain Score  0 *  Have you tolerated food without any problems? Yes.    Have you been able to return to your normal activities? Yes.    Do you have any questions about your discharge instructions: Diet   No. Medications  No. Follow up visit  No.  Do you have questions or concerns about your Care? No.  Actions: * If pain score is 4 or above: No action needed, pain <4.

## 2018-07-13 ENCOUNTER — Encounter: Payer: Self-pay | Admitting: Internal Medicine

## 2018-07-30 IMAGING — CT CT CHEST W/O CM
2 of 3 series · 15 of 36 positions shown, 18 images · non-contrast
Comparison: None.

ONE YEAR CLINICAL DATA:
Air chronic cough chest x-ray 11/28/2016

EXAM:
CT CHEST WITHOUT CONTRAST
TECHNIQUE: Multidetector CT imaging of the chest was performed following the
standard protocol without IV contrast.

[Series 2: thorax · axial · 0.73mm/px · z∈[-304,-56]mm · 12 of 146 slices shown, 15 images]
[im 11/146  mediastinal]
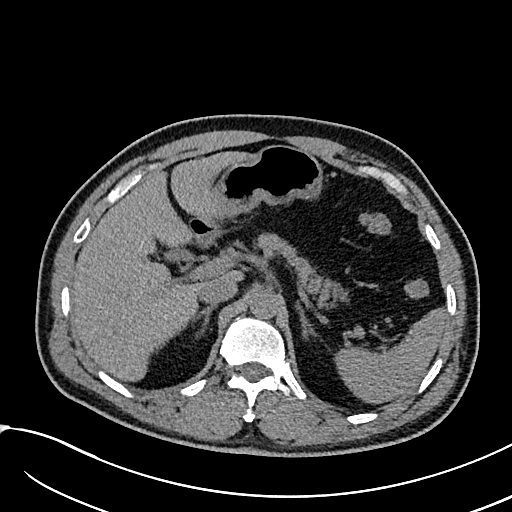
[im 11/146  lung]
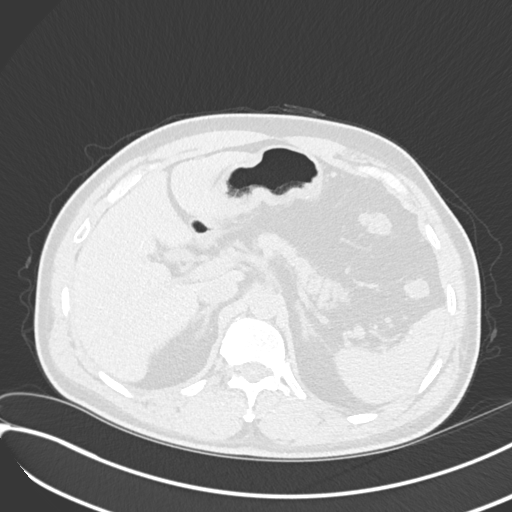
[im 22/146  lung]
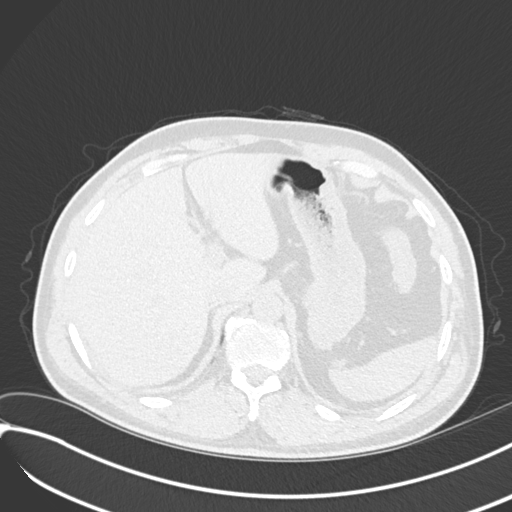
[im 33/146  lung]
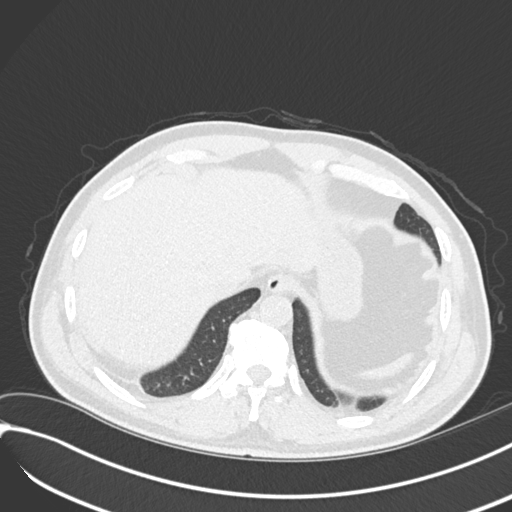
[im 43/146  lung]
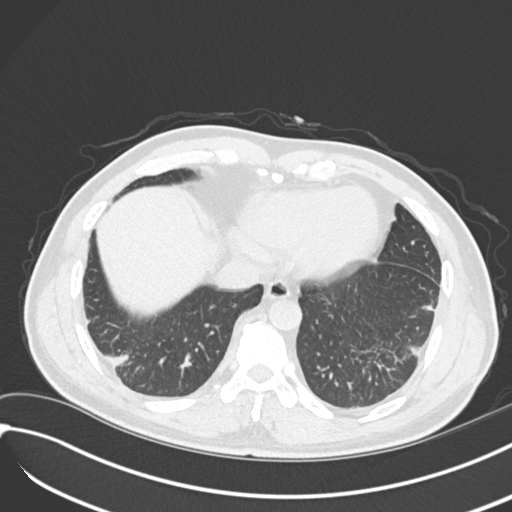
[im 54/146  mediastinal]
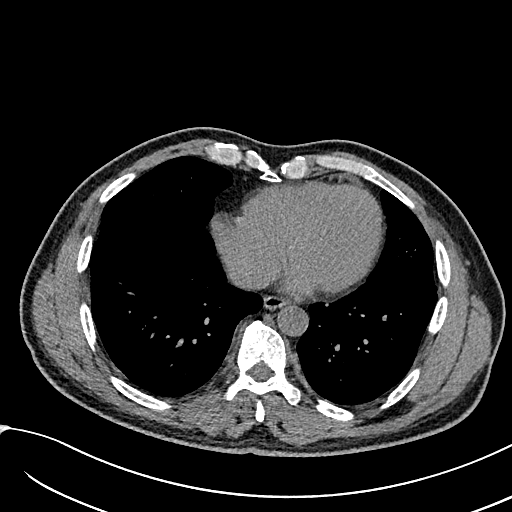
[im 54/146  lung]
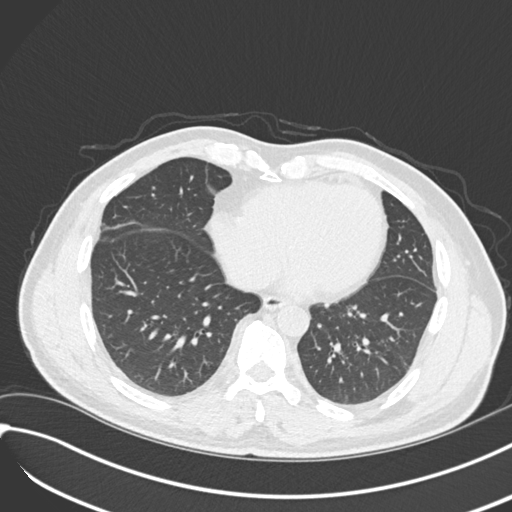
[im 65/146  lung]
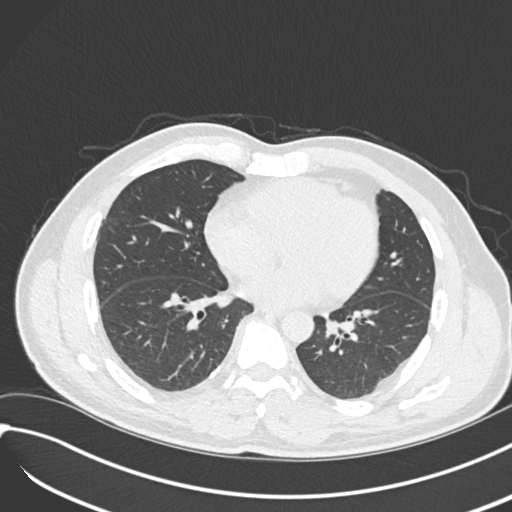
[im 81/146  lung]
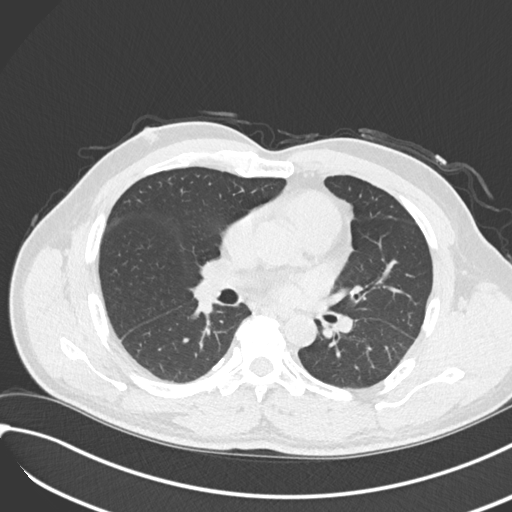
[im 92/146  lung]
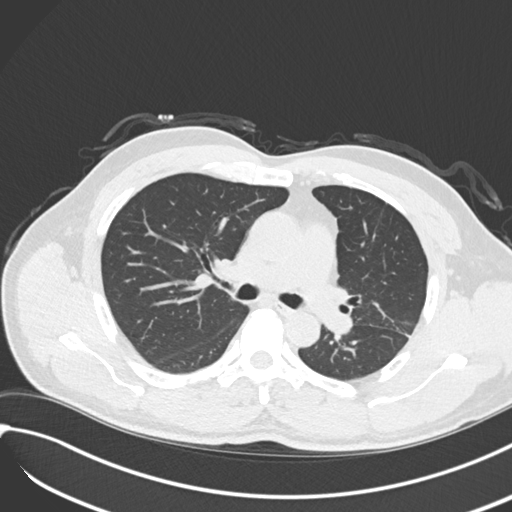
[im 103/146  mediastinal]
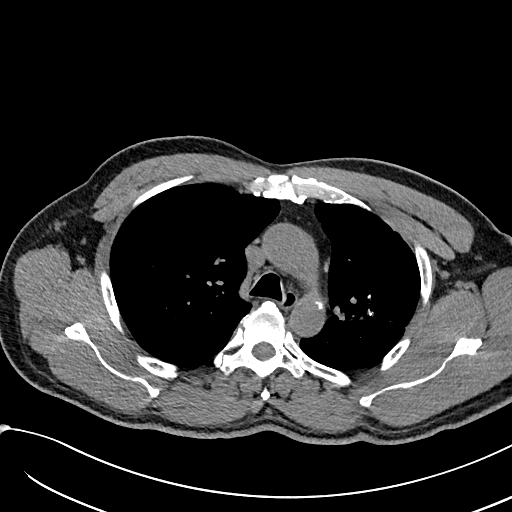
[im 103/146  lung]
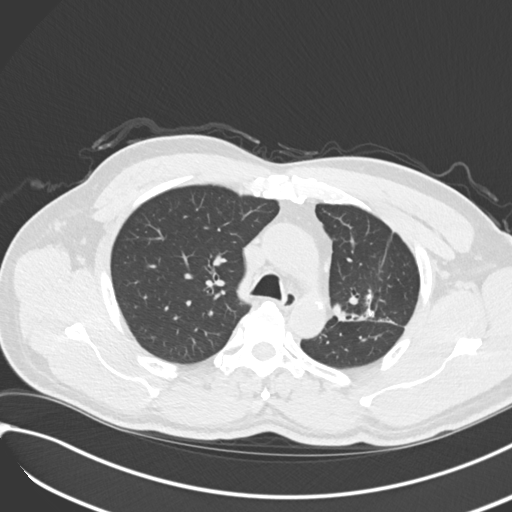
[im 113/146  lung]
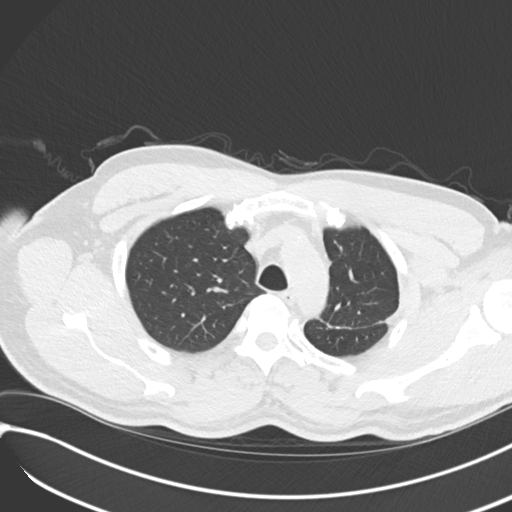
[im 124/146  lung]
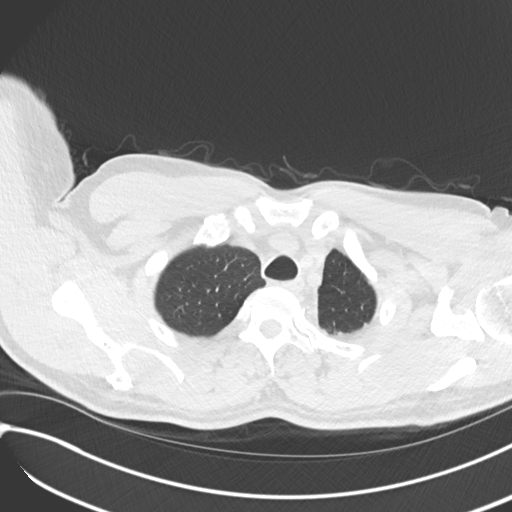
[im 135/146  lung]
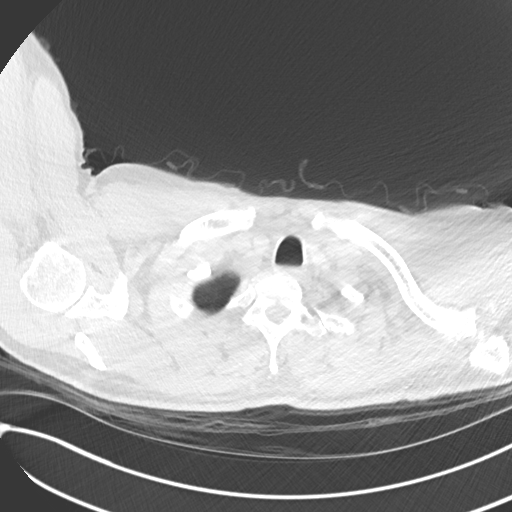

[Series 5: coronal · coronal · 0.59mm/px · 3 of 112 slices shown]
[im 23/112  lung]
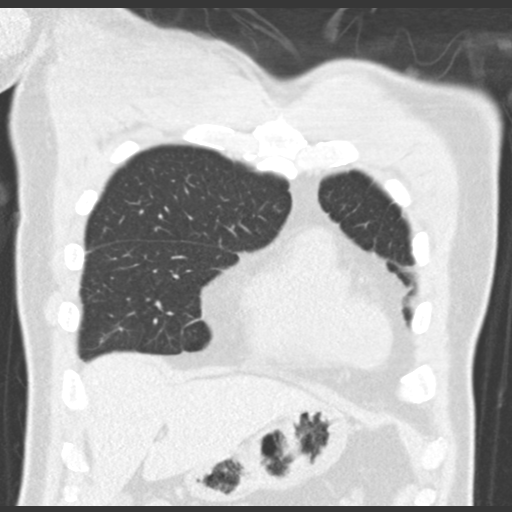
[im 45/112  lung]
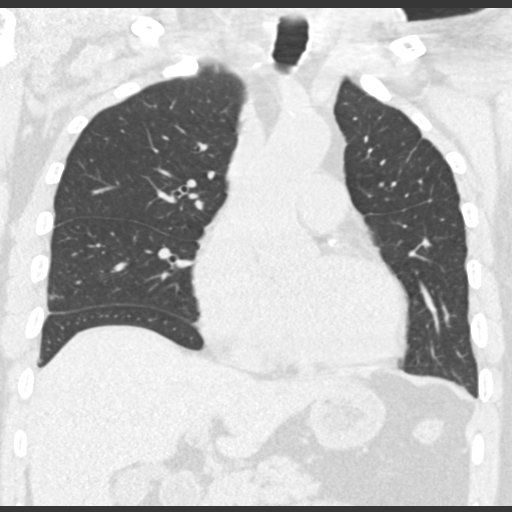
[im 67/112  lung]
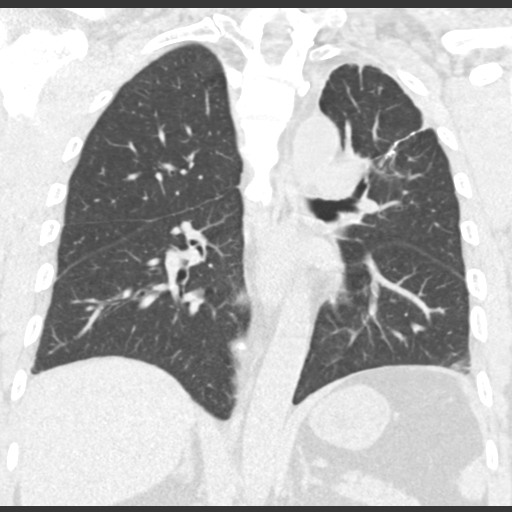

[15 of 36 positions shown; findings below may reference images not displayed]

FINDINGS: Cardiovascular: Heart is normal size. Aorta is normal caliber.
Calcifications noted in the left anterior descending coronary
artery. Scattered calcifications in the aortic arch.

Mediastinum/Nodes: No mediastinal, hilar, or axillary adenopathy.

Lungs/Pleura: Postoperative changes in the left upper lobe with
scarring. Linear scarring in both lung bases. Calcified pleural
plaque noted along the right hemidiaphragm. No confluent opacities
or effusions.

Upper Abdomen: Imaging into the upper abdomen shows no acute
findings.

Musculoskeletal: Chest wall soft tissues are unremarkable. No acute
bony abnormality.
IMPRESSION: Postoperative changes in the left upper lobe with scarring in the
left upper lobe. Bibasilar scarring. Calcified pleural plaque along
the right hemidiaphragm.

Coronary artery disease.

No acute findings.

## 2018-10-09 ENCOUNTER — Emergency Department (HOSPITAL_COMMUNITY)
Admission: EM | Admit: 2018-10-09 | Discharge: 2018-10-09 | Disposition: A | Discharge status: Home / Self Care | Payer: BLUE CROSS/BLUE SHIELD | Attending: Emergency Medicine | Admitting: Emergency Medicine

## 2018-10-09 ENCOUNTER — Encounter (HOSPITAL_COMMUNITY): Payer: Self-pay

## 2018-10-09 ENCOUNTER — Emergency Department (HOSPITAL_COMMUNITY): Payer: BLUE CROSS/BLUE SHIELD

## 2018-10-09 ENCOUNTER — Other Ambulatory Visit: Payer: Self-pay

## 2018-10-09 DIAGNOSIS — N23 Unspecified renal colic: Secondary | ICD-10-CM | POA: Diagnosis not present

## 2018-10-09 DIAGNOSIS — N1330 Unspecified hydronephrosis: Secondary | ICD-10-CM

## 2018-10-09 DIAGNOSIS — Z87891 Personal history of nicotine dependence: Secondary | ICD-10-CM | POA: Diagnosis not present

## 2018-10-09 DIAGNOSIS — Z79899 Other long term (current) drug therapy: Secondary | ICD-10-CM | POA: Insufficient documentation

## 2018-10-09 DIAGNOSIS — R1084 Generalized abdominal pain: Secondary | ICD-10-CM | POA: Diagnosis present

## 2018-10-09 DIAGNOSIS — N201 Calculus of ureter: Secondary | ICD-10-CM

## 2018-10-09 DIAGNOSIS — N132 Hydronephrosis with renal and ureteral calculous obstruction: Secondary | ICD-10-CM | POA: Insufficient documentation

## 2018-10-09 LAB — BASIC METABOLIC PANEL
Anion gap: 14 (ref 5–15)
BUN: 15 mg/dL (ref 8–23)
CHLORIDE: 103 mmol/L (ref 98–111)
CO2: 21 mmol/L — AB (ref 22–32)
CREATININE: 1.14 mg/dL (ref 0.61–1.24)
Calcium: 9.3 mg/dL (ref 8.9–10.3)
GFR calc Af Amer: >60 mL/min (ref >60)
GFR calc non Af Amer: >60 mL/min (ref >60)
GLUCOSE: 117 mg/dL — AB (ref 70–99)
Potassium: 3.5 mmol/L (ref 3.5–5.1)
Sodium: 138 mmol/L (ref 135–145)

## 2018-10-09 LAB — URINALYSIS, ROUTINE W REFLEX MICROSCOPIC
BILIRUBIN URINE: NEGATIVE
GLUCOSE, UA: NEGATIVE mg/dL
KETONES UR: NEGATIVE mg/dL
LEUKOCYTES UA: NEGATIVE
NITRITE: NEGATIVE
PH: 9 — AB (ref 5.0–8.0)
Protein, ur: NEGATIVE mg/dL
SPECIFIC GRAVITY, URINE: 1.011 (ref 1.005–1.030)

## 2018-10-09 LAB — CBC WITH DIFFERENTIAL/PLATELET
Abs Immature Granulocytes: 0.04 10*3/uL (ref 0.00–0.07)
Basophils Absolute: 0 10*3/uL (ref 0.0–0.1)
Basophils Relative: 0 %
EOS PCT: 0 %
Eosinophils Absolute: 0 10*3/uL (ref 0.0–0.5)
HEMATOCRIT: 45.1 % (ref 39.0–52.0)
HEMOGLOBIN: 14.7 g/dL (ref 13.0–17.0)
Immature Granulocytes: 0 %
LYMPHS ABS: 1.6 10*3/uL (ref 0.7–4.0)
LYMPHS PCT: 14 %
MCH: 24.3 pg — ABNORMAL LOW (ref 26.0–34.0)
MCHC: 32.6 g/dL (ref 30.0–36.0)
MCV: 74.4 fL — AB (ref 80.0–100.0)
MONOS PCT: 3 %
Monocytes Absolute: 0.4 10*3/uL (ref 0.1–1.0)
Neutro Abs: 9.4 10*3/uL — ABNORMAL HIGH (ref 1.7–7.7)
Neutrophils Relative %: 83 %
Platelets: 212 10*3/uL (ref 150–400)
RBC: 6.06 MIL/uL — ABNORMAL HIGH (ref 4.22–5.81)
RDW: 12.6 % (ref 11.5–15.5)
WBC: 11.4 10*3/uL — ABNORMAL HIGH (ref 4.0–10.5)
nRBC: 0 % (ref 0.0–0.2)

## 2018-10-09 MED ORDER — HYDROMORPHONE HCL 1 MG/ML IJ SOLN
1.0000 mg | Freq: Once | INTRAMUSCULAR | Status: AC
  Administered 2018-10-09: 1 mg via INTRAVENOUS

## 2018-10-09 MED ORDER — SODIUM CHLORIDE 0.9 % IV SOLN
1.5000 mg/kg | Freq: Once | INTRAVENOUS | Status: AC
  Administered 2018-10-09: 110 mg via INTRAVENOUS
  Filled 2018-10-09: qty 5.5

## 2018-10-09 MED ORDER — ONDANSETRON HCL 4 MG/2ML IJ SOLN
4.0000 mg | Freq: Once | INTRAMUSCULAR | Status: AC
  Administered 2018-10-09: 4 mg via INTRAVENOUS
  Filled 2018-10-09: qty 2

## 2018-10-09 MED ORDER — HYDROMORPHONE HCL 1 MG/ML IJ SOLN
1.0000 mg | Freq: Once | INTRAMUSCULAR | Status: AC
  Administered 2018-10-09: 1 mg via INTRAVENOUS
  Filled 2018-10-09: qty 1

## 2018-10-09 MED ORDER — ACETAMINOPHEN 10 MG/ML IV SOLN
1000.0000 mg | Freq: Four times a day (QID) | INTRAVENOUS | Status: DC
  Administered 2018-10-09: 1000 mg via INTRAVENOUS
  Filled 2018-10-09 (×3): qty 100

## 2018-10-09 MED ORDER — TAMSULOSIN HCL 0.4 MG PO CAPS: ORAL_CAPSULE | ORAL | 0 refills | Status: DC

## 2018-10-09 MED ORDER — HYDROMORPHONE HCL 1 MG/ML IJ SOLN
1.0000 mg | Freq: Once | INTRAMUSCULAR | Status: DC
  Filled 2018-10-09: qty 1

## 2018-10-09 MED ORDER — ONDANSETRON HCL 8 MG PO TABS: 8.0000 mg | ORAL_TABLET | Freq: Three times a day (TID) | ORAL | 0 refills | Status: DC | PRN

## 2018-10-09 MED ORDER — KETOROLAC TROMETHAMINE 30 MG/ML IJ SOLN
30.0000 mg | Freq: Once | INTRAMUSCULAR | Status: AC
  Administered 2018-10-09: 30 mg via INTRAVENOUS
  Filled 2018-10-09: qty 1

## 2018-10-09 MED ORDER — FENTANYL CITRATE (PF) 100 MCG/2ML IJ SOLN
100.0000 ug | INTRAMUSCULAR | Status: DC | PRN
  Administered 2018-10-09 (×3): 100 ug via INTRAVENOUS
  Filled 2018-10-09 (×3): qty 2

## 2018-10-09 MED ORDER — HYDROCODONE-ACETAMINOPHEN 5-325 MG PO TABS: 1.0000 | ORAL_TABLET | ORAL | 0 refills | Status: DC | PRN

## 2018-10-09 NOTE — Discharge Instructions (Signed)
The pain in the left flank is caused by a kidney stone in the proximal left ureter.  Take the prescription medicines as prescribed, to prevent pain, and improve passage of the stone.  Strain the urine to catch the stone and take it to the urologist.  Follow-up with [facility] if you choose to get care in [place] or with the urologist of your choice in [place], [place].

## 2018-10-09 NOTE — ED Triage Notes (Signed)
Pt from home via em[facility]; c/o severe L flank pain that started after urination today around 1345; 10/10 pain; no blood in urine; denies dysuria, frequency; pt also c/o productive cough for which he was recently given antibiotics and steroids  180/84 HR 76 100% RA RR 20

## 2018-10-09 NOTE — Consult Note (Addendum)
Urology Consult Note   Requesting Attending Physician:  Daleen Bo, MD Service Providing Consult: Urology  Consulting Attending: Kathie Rhodes, MD   Reason for Consult: Uncontrolled flank pain  HPI: Garrett Henry is seen in consultation for reasons noted above at the request of Daleen Bo, MD for evaluation of uncontrolled flank pain due to obstructing ureteral stone.  This is a 62 y.o. male with a history of GERD, lung resection who presents with uncontrolled LEFT flank pain. He denies fevers, chills, or dysuria. Endorses nausea and dry heaving. He received 300 mcg of fentanyl and 2 mg of dilaudid and reported ongoing pain.   CT scan demonstrates a 6 mm (vs 2 adjacent) stone in the left proximal ureter and a 2 mm punctate stone in the left distal ureter with associated mild to moderate left hydronephrosis and hydroureter.   He does not have a history of kidney stones.   Also has a productive cough, for which he was recently prescribed antibiotics and steroids. Satting 84-88% in the ED without shortness of breath. Reports that is around his baseline oxygen saturations s/p lung resection.   Past Medical History: Past Medical History:  Diagnosis Date  . Acute bronchitis   . Bronchitis   . Cough   . GERD (gastroesophageal reflux disease)   . PNA (pneumonia)     Past Surgical History:  Past Surgical History:  Procedure Laterality Date  . LUNG REMOVAL, PARTIAL Left     Medication: Current Facility-Administered Medications  Medication Dose Route Frequency Provider Last Rate Last Dose  . fentaNYL (SUBLIMAZE) injection 100 mcg  100 mcg Intravenous Q15 min PRN Daleen Bo, MD   100 mcg at 10/09/18 1752  . HYDROmorphone (DILAUDID) injection 1 mg  1 mg Intravenous Once Daleen Bo, MD       Current Outpatient Medications  Medication Sig Dispense Refill  . azithromycin (ZITHROMAX) 250 MG tablet Take 250-500 mg by mouth See admin instructions. Take 2 tablet by mouth on day 1,  then 1 tablet by mouth on day 2-5    . benzonatate (TESSALON) 200 MG capsule Take 200 mg by mouth every 8 (eight) hours as needed for cough.    Marland Kitchen HYDROcodone-homatropine (HYCODAN) 5-1.5 MG/5ML syrup Take 5 mLs by mouth every 6 (six) hours as needed for cough.    . pantoprazole (PROTONIX) 40 MG tablet Take 40 mg by mouth daily.    . predniSONE (DELTASONE) 10 MG tablet Take 10-30 mg by mouth See admin instructions. Take 3 tablet by mouth for 3 days, then take 2 tabs for 4 days, then 1 tab for 4 days      Allergies: Allergies  Allergen Reactions  . Amoxicillin     Pressure in face    Social History: Social History   Tobacco Use  . Smoking status: Former Smoker    Packs/day: 0.50    Years: 20.00    Pack years: 10.00    Types: Cigarettes    Last attempt to quit: 10/22/1993    Years since quitting: 24.9  . Smokeless tobacco: Never Used  . Tobacco comment: smoked tobacco until 1992, then started smoking cigerettes.  Substance Use Topics  . Alcohol use: No    Alcohol/week: 0.0 standard drinks  . Drug use: No    Family History Family History  Problem Relation Age of Onset  . Stroke Father   . Colon cancer Neg Hx     Review of Systems 10 systems were reviewed and are negative except as noted  specifically in the HPI.  Objective   Vital signs in last 24 hours: BP (!) 152/86   Pulse 76   Temp 98.1 F (36.7 C) (Oral)   Resp 18   Ht 5\' 4"  (1.626 m)   Wt 73.5 kg   SpO2 100%   BMI 27.81 kg/m   Physical Exam General: NAD, A&O, resting, appropriate HEENT: [place]/AT, EOMI, MMM Pulmonary: Normal work of breathing, but satting 84-88% on room air unless being instructed to take deep breaths, at which point it easily improves to > 92% Cardiovascular: HDS, adequate peripheral perfusion Abdomen: Soft, NTTP, nondistended GU: No CVA tenderness bilaterally Extremities: warm and well perfused Neuro: Appropriate, no focal neurological deficits  Most Recent Labs: Lab Results  Component  Value Date   WBC 7.5 05/19/2018   HGB 15.0 05/19/2018   HCT 47.4 05/19/2018   PLT 163.0 05/19/2018    No results found for: NA, K, CL, CO2, BUN, CREATININE, CALCIUM, MG, PHOS  No results found for: INR, APTT   IMAGING: Ct Renal Stone Study  Result Date: 10/09/2018 CLINICAL DATA:  Left-sided flank pain EXAM: CT ABDOMEN AND PELVIS WITHOUT CONTRAST TECHNIQUE: Multidetector CT imaging of the abdomen and pelvis was performed following the standard protocol without IV contrast. COMPARISON:  CT chest 12/05/2016 FINDINGS: Lower chest: Lung bases demonstrate linear atelectasis or scarring. No consolidation or effusion. Normal heart size. Right lung base pleural calcification. Hepatobiliary: No focal liver abnormality is seen. No gallstones, gallbladder wall thickening, or biliary dilatation. Pancreas: Unremarkable. No pancreatic ductal dilatation or surrounding inflammatory changes. Spleen: Normal in size without focal abnormality. Adrenals/Urinary Tract: Adrenal glands are normal. No right hydronephrosis. Mild to moderate left hydronephrosis and proximal hydroureter, secondary to 2 adjacent or single prominent stone within the proximal left ureter about a cm distal to the left UPJ measuring 6 mm. Additional punctate 2 mm stone in the distal left ureter, just proximal to the left UVJ. Additional punctate stones within the lower left kidney. Stomach/Bowel: Stomach is within normal limits. Appendix appears normal. No evidence of bowel wall thickening, distention, or inflammatory changes. Vascular/Lymphatic: Nonaneurysmal aorta. Mild aortic atherosclerosis. Reproductive: Slightly enlarged prostate with mass effect on the bladder Other: Negative for free air or free fluid. Mild hazy density with small lymph nodes within the mid abdominal mesentery slightly to the left of mid line at the level of the umbilicus. Musculoskeletal: No acute or significant osseous findings. Mild degenerative changes. IMPRESSION: 1.  Mild to moderate left hydronephrosis and proximal hydroureter, secondary to 2 adjacent or single 6 mm stone in the proximal left ureter about a cm distal to the left UPJ. Additional 2 mm punctate stone in the distal left ureter just proximal to the left UVJ. Additional small stones in the left kidney 2. Hazy edema within the left mid abdominal mesentery at the level of the umbilicus, possible mesenteritis/adenitis. Electronically Signed   By: Donavan Foil M.D.   On: 10/09/2018 16:33    ------  Assessment:  62 y.o. male with obstructing left ureteral stones and uncontrolled flank pain. No signs of infection on his urinalysis. Mild leukocytosis at 11. Creatinine is 1.14. He is currently hemodynamically stable after being hypertensive previously.   We discussed management options for acute obstruction of the renal collecting system, as well as indications for intervention. The patient's pain is currently uncontrolled, but he has only received narcotic medications.   Discussed that management options include trial of passage with optimized pain regimen, placement of a ureteral stent with decompression  of the left renal collecting system or placement of a nephrostomy tube. Discussed risks and benefits of each option. Do not recommend nephrostomy tube given increased risks associated with it. Discussed that stents and nephrostomy tubes are frequently uncomfortable, but typically relieve pain associated with ureteral obstruction. Risks of placement include risks of anesthesia, bleeding, infection, damage to surrounding organs and blood vessels. In setting of a ureteral stent, there is always a risk that we are unable to access the ureter proximal to the obstruction and, therefore, unable to place a ureteral stent. Discussed that stent placement would likely require an additional surgery for stone treatment.   Patient would like to proceed with optimization of his pain control.   Recommendations: - Keep  patient NPO - Recommend pain optimization with the following medications: IV lidocaine 2% (1.5 mg / kg w/maximum dose of 200 mg, mixed in 100 cc NS and infused over 10 minutes), IV toradol, IV tylenol.   - If patient's pain is controlled, okay to discharge with tylenol, po toradol, po narcotics and urologic follow-up.  - Send and follow-up urine culture. - Defer to ED regarding work-up of patient's hypoxia since he is asymptomatic and reports baseline saturations in the 80s following partial lung resection.  - If patient's pain remains uncontrolled, please notify urology for discussion of further management.   Discussed with Dr. Karsten Ro.   Thank you for this consult. Please contact the urology consult pager with any further questions/concerns.

## 2018-10-09 NOTE — ED Provider Notes (Signed)
[facility] EMERGENCY DEPARTMENT Provider Note   CSN: [id number] Arrival date & time: 10/09/18  1500     History   Chief Complaint Chief Complaint  Patient presents with  . Flank Pain    HPI Garrett Henry is a 62 y.o. male.  HPI   He had sudden onset of left flank pain today after urinating.  The pain is persistent, and severe.  No recent dysuria, hematuria, fever, chills, nausea or vomiting.  No prior similar symptoms.  There are no other known modifying factors.  Past Medical History:  Diagnosis Date  . Acute bronchitis   . Bronchitis   . Cough   . GERD (gastroesophageal reflux disease)   . PNA (pneumonia)     Patient Active Problem List   Diagnosis Date Noted  . Restrictive lung disease 05/23/2016  . GERD (gastroesophageal reflux disease) 05/23/2016    Past Surgical History:  Procedure Laterality Date  . LUNG REMOVAL, PARTIAL Left         Home Medications    Prior to Admission medications   Medication Sig Start Date End Date Taking? Authorizing Provider  azithromycin (ZITHROMAX) 250 MG tablet Take 250-500 mg by mouth See admin instructions. Take 2 tablet by mouth on day 1, then 1 tablet by mouth on day 2-5   Yes [provider]  benzonatate (TESSALON) 200 MG capsule Take 200 mg by mouth every 8 (eight) hours as needed for cough.   Yes [provider]  HYDROcodone-homatropine (HYCODAN) 5-1.5 MG/5ML syrup Take 5 mLs by mouth every 6 (six) hours as needed for cough.   Yes [provider]  pantoprazole (PROTONIX) 40 MG tablet Take 40 mg by mouth daily.   Yes [provider]  predniSONE (DELTASONE) 10 MG tablet Take 10-30 mg by mouth See admin instructions. Take 3 tablet by mouth for 3 days, then take 2 tabs for 4 days, then 1 tab for 4 days   Yes [provider]    Family History Family History  Problem Relation Age of Onset  . Stroke Father   . Colon cancer Neg Hx     Social History Social  History   Tobacco Use  . Smoking status: Former Smoker    Packs/day: 0.50    Years: 20.00    Pack years: 10.00    Types: Cigarettes    Last attempt to quit: 10/22/1993    Years since quitting: 24.9  . Smokeless tobacco: Never Used  . Tobacco comment: smoked tobacco until 1992, then started smoking cigerettes.  Substance Use Topics  . Alcohol use: No    Alcohol/week: 0.0 standard drinks  . Drug use: No     Allergies   Amoxicillin   Review of Systems Review of Systems  All other systems reviewed and are negative.    Physical Exam Updated Vital Signs BP (!) 151/67   Pulse 73   Temp 98.1 F (36.7 C) (Oral)   Resp 18   Ht 5\' 4"  (1.626 m)   Wt 73.5 kg   SpO2 98%   BMI 27.81 kg/m   Physical Exam Vitals signs and nursing note reviewed.  Constitutional:      General: He is in acute distress.     Appearance: He is well-developed. He is ill-appearing. He is not toxic-appearing or diaphoretic.  HENT:     Head: Normocephalic and atraumatic.     Right Ear: External ear normal.     Left Ear: External ear normal.  Eyes:  Conjunctiva/sclera: Conjunctivae normal.     Pupils: Pupils are equal, round, and reactive to light.  Neck:     Musculoskeletal: Normal range of motion and neck supple.     Trachea: Phonation normal.  Cardiovascular:     Rate and Rhythm: Normal rate and regular rhythm.     Heart sounds: Normal heart sounds.  Pulmonary:     Effort: Pulmonary effort is normal.     Breath sounds: Normal breath sounds.  Abdominal:     Palpations: Abdomen is soft.     Tenderness: There is no abdominal tenderness.  Genitourinary:    Comments: Flanks nontender bilaterally. Musculoskeletal: Normal range of motion.  Skin:    General: Skin is warm and dry.  Neurological:     Mental Status: He is alert and oriented to person, place, and time.     Cranial Nerves: No cranial nerve deficit.     Sensory: No sensory deficit.     Motor: No abnormal muscle tone.      Coordination: Coordination normal.  Psychiatric:        Behavior: Behavior normal.        Thought Content: Thought content normal.        Judgment: Judgment normal.      ED Treatments / Results  Labs (all labs ordered are listed, but only abnormal results are displayed) Labs Reviewed  URINALYSIS, ROUTINE W REFLEX MICROSCOPIC - Abnormal; Notable for the following components:      Result Value   Color, Urine STRAW (*)    pH 9.0 (*)    Hgb urine dipstick SMALL (*)    Bacteria, UA RARE (*)    All other components within normal limits  BASIC METABOLIC PANEL - Abnormal; Notable for the following components:   CO2 21 (*)    Glucose, Bld 117 (*)    All other components within normal limits  CBC WITH DIFFERENTIAL/PLATELET - Abnormal; Notable for the following components:   WBC 11.4 (*)    RBC 6.06 (*)    MCV 74.4 (*)    MCH 24.3 (*)    Neutro Abs 9.4 (*)    All other components within normal limits    EKG None  Radiology Ct Renal Stone Study  Result Date: 10/09/2018 CLINICAL DATA:  Left-sided flank pain EXAM: CT ABDOMEN AND PELVIS WITHOUT CONTRAST TECHNIQUE: Multidetector CT imaging of the abdomen and pelvis was performed following the standard protocol without IV contrast. COMPARISON:  CT chest 12/05/2016 FINDINGS: Lower chest: Lung bases demonstrate linear atelectasis or scarring. No consolidation or effusion. Normal heart size. Right lung base pleural calcification. Hepatobiliary: No focal liver abnormality is seen. No gallstones, gallbladder wall thickening, or biliary dilatation. Pancreas: Unremarkable. No pancreatic ductal dilatation or surrounding inflammatory changes. Spleen: Normal in size without focal abnormality. Adrenals/Urinary Tract: Adrenal glands are normal. No right hydronephrosis. Mild to moderate left hydronephrosis and proximal hydroureter, secondary to 2 adjacent or single prominent stone within the proximal left ureter about a cm distal to the left UPJ measuring  6 mm. Additional punctate 2 mm stone in the distal left ureter, just proximal to the left UVJ. Additional punctate stones within the lower left kidney. Stomach/Bowel: Stomach is within normal limits. Appendix appears normal. No evidence of bowel wall thickening, distention, or inflammatory changes. Vascular/Lymphatic: Nonaneurysmal aorta. Mild aortic atherosclerosis. Reproductive: Slightly enlarged prostate with mass effect on the bladder Other: Negative for free air or free fluid. Mild hazy density with small lymph nodes within the mid abdominal mesentery  slightly to the left of mid line at the level of the umbilicus. Musculoskeletal: No acute or significant osseous findings. Mild degenerative changes. IMPRESSION: 1. Mild to moderate left hydronephrosis and proximal hydroureter, secondary to 2 adjacent or single 6 mm stone in the proximal left ureter about a cm distal to the left UPJ. Additional 2 mm punctate stone in the distal left ureter just proximal to the left UVJ. Additional small stones in the left kidney 2. Hazy edema within the left mid abdominal mesentery at the level of the umbilicus, possible mesenteritis/adenitis. Electronically Signed   By: Donavan Foil M.D.   On: 10/09/2018 16:33    Procedures .Critical Care Performed by: Daleen Bo, MD Authorized by: Daleen Bo, MD   Critical care provider statement:    Critical care time (minutes):  35   Critical care start time:  10/09/2018 3:10 PM   Critical care end time:  10/09/2018 11:09 PM   Critical care time was exclusive of:  Separately billable procedures and treating other patients   Critical care was necessary to treat or prevent imminent or life-threatening deterioration of the following conditions: Renal colic.   Critical care was time spent personally by me on the following activities:  Blood draw for specimens, development of treatment plan with patient or surrogate, discussions with consultants, evaluation of patient's  response to treatment, examination of patient, obtaining history from patient or surrogate, ordering and performing treatments and interventions, ordering and review of laboratory studies, pulse oximetry, re-evaluation of patient's condition, review of old charts and ordering and review of radiographic studies   (including critical care time)  Medications Ordered in ED Medications  fentaNYL (SUBLIMAZE) injection 100 mcg (100 mcg Intravenous Given 10/09/18 1752)  HYDROmorphone (DILAUDID) injection 1 mg (0 mg Intravenous Hold 10/09/18 2115)  acetaminophen (OFIRMEV) IV 1,000 mg (0 mg Intravenous Stopped 10/09/18 2241)  ondansetron (ZOFRAN) injection 4 mg (4 mg Intravenous Given 10/09/18 1529)  HYDROmorphone (DILAUDID) injection 1 mg (1 mg Intravenous Given 10/09/18 1803)  HYDROmorphone (DILAUDID) injection 1 mg (1 mg Intravenous Given 10/09/18 1823)  ketorolac (TORADOL) 30 MG/ML injection 30 mg (30 mg Intravenous Given 10/09/18 2111)  lidocaine (XYLOCAINE) 110 mg in sodium chloride 0.9 % 100 mL IVPB (0 mg/kg  73.5 kg Intravenous Stopped 10/09/18 2146)  ondansetron (ZOFRAN) injection 4 mg (4 mg Intravenous Given 10/09/18 2241)     Initial Impression / Assessment and Plan / ED Course  I have reviewed the triage vital signs and the nursing notes.  Pertinent labs & imaging results that were available during my care of the patient were reviewed by me and considered in my medical decision making (see chart for details).  Clinical Course as of Oct 09 2310  Thu Oct 09, 2018  1805 Normal except small blood on dipstick and rare bacteria.  Urinalysis, Routine w reflex microscopic(!) [EW]  1805 At this time he is again very uncomfortable with severe left flank pain.  He has had transient relief with 3 separate fentanyl doses.  Will try dose of Dilaudid, then consider disposition.   [EW]  5170 Large proximal left ureter stone with hydronephrosis.  Images reviewed by me.  CT Renal Laren Everts [EW]    0174 Urology was consulted for intractable pain.  They want to have CBC and Bmet before making a decision on treatment modality at this time.   [EW]  2100 Urology requested additional medications, prior to decision to treat.  I ordered IV Toradol, IV Tylenol, IV lidocaine.   [  EW]  2255 Case again discussed with urology, who is comfortable with the patient being discharged at this time.   [EW]  2307 Patient has been observed on cardiac monitor, no arrhythmias.   [EW]    Clinical Course User Index [EW] Daleen Bo, MD     Patient Vitals for the past 24 hrs:  BP Temp Temp src Pulse Resp SpO2 Height Weight  10/09/18 2245 (!) 151/67 - - 73 - 98 % - -  10/09/18 2230 (!) 141/69 - - 68 - 99 % - -  10/09/18 2215 (!) 141/70 - - 66 - 98 % - -  10/09/18 2200 (!) 144/74 - - 71 - 99 % - -  10/09/18 2145 (!) 142/75 - - 72 - 99 % - -  10/09/18 2130 (!) 142/71 - - 79 - 99 % - -  10/09/18 2045 (!) 141/70 - - 70 - 99 % - -  10/09/18 2030 (!) 143/75 - - 68 - 98 % - -  10/09/18 2000 (!) 155/72 - - 76 - 97 % - -  10/09/18 1815 (!) 152/86 - - 76 - 100 % - -  10/09/18 1800 (!) 148/84 - - 80 - 100 % - -  10/09/18 1745 (!) 160/87 - - 62 - 100 % - -  10/09/18 1730 (!) 145/76 - - (!) 58 - 98 % - -  10/09/18 1715 (!) 146/64 - - 61 - 90 % - -  10/09/18 1700 (!) 142/66 - - 68 - 96 % - -  10/09/18 1630 (!) 144/76 - - 66 - 98 % - -  10/09/18 1600 (!) 152/75 - - 68 - (!) 89 % - -  10/09/18 1545 (!) 165/68 - - 76 - 100 % - -  10/09/18 1508 - - - - - - 5\' 4"  (1.626 m) 73.5 kg  10/09/18 1507 (!) 198/102 98.1 F (36.7 C) Oral 66 18 98 % - -    11:06 PM Reevaluation with update and discussion. After initial assessment and treatment, an updated evaluation reveals patient is now comfortable, and would like to try going home.  Findings discussed, questions answered.  Optical disc with images obtained at the request of family member.  She plans on helping him follow-up with the urologist in [facility]. Daleen Bo   Medical Decision Making: Flank pain secondary to proximal ureter stone, moderately large at 6 mm versus tandem stones.  Patient's pain has been controlled he is therefore stable for discharge.  There is no evidence for UTI, acute renal failure or sepsis.  CRITICAL CARE-yes Performed by: Daleen Bo  Nursing Notes Reviewed/ Care Coordinated Applicable Imaging Reviewed Interpretation of Laboratory Data incorporated into ED treatment  The patient appears reasonably screened and/or stabilized for discharge and I doubt any other medical condition or other [facility] requiring further screening, evaluation, or treatment in the ED at this time prior to discharge.  Plan: Home Medications-continue current medications; Home Treatments-strain urine, gradually [employer]; return here if the recommended treatment, does not improve the symptoms; Recommended follow up-urology follow-up within 1 week, sooner if needed for problems   Final Clinical Impressions(s) / ED Diagnoses   Final diagnoses:  Renal colic on left side  Hydronephrosis of left kidney  Left ureteral stone    ED Discharge Orders    None       Daleen Bo, MD 10/09/18 2336

## 2018-12-11 ENCOUNTER — Other Ambulatory Visit: Payer: Self-pay | Admitting: Family Medicine

## 2018-12-12 ENCOUNTER — Other Ambulatory Visit: Payer: Self-pay | Admitting: Family Medicine

## 2018-12-12 DIAGNOSIS — R319 Hematuria, unspecified: Secondary | ICD-10-CM

## 2018-12-12 DIAGNOSIS — R109 Unspecified abdominal pain: Secondary | ICD-10-CM

## 2018-12-29 ENCOUNTER — Ambulatory Visit
Admission: RE | Admit: 2018-12-29 | Discharge: 2018-12-29 | Disposition: A | Discharge status: Home / Self Care | Payer: No Typology Code available for payment source | Source: Ambulatory Visit | Attending: Family Medicine | Admitting: Family Medicine

## 2018-12-29 DIAGNOSIS — R319 Hematuria, unspecified: Secondary | ICD-10-CM

## 2018-12-29 DIAGNOSIS — R109 Unspecified abdominal pain: Secondary | ICD-10-CM

## 2020-02-04 ENCOUNTER — Ambulatory Visit: Payer: BLUE CROSS/BLUE SHIELD | Attending: Internal Medicine

## 2020-02-04 DIAGNOSIS — Z23 Encounter for immunization: Secondary | ICD-10-CM

## 2020-02-04 NOTE — Progress Notes (Signed)
   Covid-19 Vaccination Clinic  Name:  Garrett Henry    MRN: [id number] DOB: [birth year]  02/04/2020  [name] was observed post Covid-19 immunization for 15 minutes without incident. He was provided with Vaccine Information Sheet and instruction to access the V-Safe system.   [name] was instructed to call 911 with any severe reactions post vaccine: Marland Kitchen Difficulty breathing  . Swelling of face and throat  . A fast heartbeat  . A bad rash all over body  . Dizziness and weakness   Immunizations Administered    Name Date Dose VIS Date Route   Pfizer COVID-19 Vaccine 02/04/2020  8:18 AM 0.3 mL 10/02/2019 Intramuscular   Manufacturer: [place]   Lot: [id number]   [place]: [id number]

## 2020-02-29 ENCOUNTER — Ambulatory Visit: Payer: BLUE CROSS/BLUE SHIELD | Attending: Internal Medicine

## 2020-02-29 DIAGNOSIS — Z23 Encounter for immunization: Secondary | ICD-10-CM

## 2020-02-29 NOTE — Progress Notes (Signed)
   Covid-19 Vaccination Clinic  Name:  Francesca Vos    MRN: [id number] DOB: 05-05-1956  02/29/2020  Mr. Wehrenberg was observed post Covid-19 immunization for 15 minutes without incident. He was provided with Vaccine Information Sheet and instruction to access the V-Safe system.   Mr. Moris was instructed to call 911 with any severe reactions post vaccine: Marland Kitchen Difficulty breathing  . Swelling of face and throat  . A fast heartbeat  . A bad rash all over body  . Dizziness and weakness   Immunizations Administered    Name Date Dose VIS Date Route   Pfizer COVID-19 Vaccine 02/29/2020  8:17 AM 0.3 mL 12/16/2018 Intramuscular   Manufacturer: [place]   Lot: [id number]   [place]: [id number]

## 2020-05-17 ENCOUNTER — Other Ambulatory Visit: Payer: Self-pay

## 2020-05-17 ENCOUNTER — Encounter (HOSPITAL_COMMUNITY): Payer: Self-pay

## 2020-05-17 ENCOUNTER — Ambulatory Visit (HOSPITAL_COMMUNITY)
Admission: EM | Admit: 2020-05-17 | Discharge: 2020-05-17 | Disposition: A | Discharge status: Home / Self Care | Payer: 59 | Attending: Urgent Care | Admitting: Urgent Care

## 2020-05-17 DIAGNOSIS — I10 Essential (primary) hypertension: Secondary | ICD-10-CM | POA: Diagnosis not present

## 2020-05-17 DIAGNOSIS — G44209 Tension-type headache, unspecified, not intractable: Secondary | ICD-10-CM

## 2020-05-17 DIAGNOSIS — R42 Dizziness and giddiness: Secondary | ICD-10-CM

## 2020-05-17 MED ORDER — LISINOPRIL 10 MG PO TABS: 10.0000 mg | ORAL_TABLET | Freq: Every day | ORAL | 0 refills | Status: DC

## 2020-05-17 NOTE — Discharge Instructions (Signed)
For diabetes or elevated blood sugar, please make sure you are avoiding starchy, carbohydrate foods like pasta, breads, pastry, rice, potatoes, desserts. These foods can elevated your blood sugar. Also, avoid sodas, sweet teas, sugary beverages, fruit juices.  Drinking plain water will be much more helpful, try 64 ounces of water daily (that's 4-5 bottles of water per day).  It is okay to flavor your water naturally by cutting cucumber, lemon, mint or lime, placing it in a picture with water and drinking it over a period of 2 to 3 days as long as it remains refrigerated.    For elevated blood pressure, make sure you are monitoring salt in your diet.  Do not eat restaurant foods and limit processed foods at home, prepare/cook your own foods at home.  Processed foods include things like frozen meals preseasoned meats and dinners, deli meats, canned foods as they are high in sodium/salt.  Make sure your pain attention to sodium labels on foods you by at the grocery store.  For seasoning you can use a brand called Mrs. Dash which includes a lot of salt free seasonings.  Salads - kale, spinach, cabbage, spring mix; use seeds like pumpkin seeds or sunflower seeds, almonds, walnuts or pecans; you can also use 1-2 hard boiled eggs in your salads Fruits - avocadoes, berries (blueberries, raspberries, blackberries), apples, oranges, pomegranate, pear; avoid eating bananas, grapes regularly Vegetables - aspargus, cauliflower, broccoli, green beans, brussel spouts, bell peppers; stay away from starchy vegetables like potatoes, carrots, peas  Regarding meat it is better to eat lean meats and limit your red meat including pork to once a week.  Wild caught fish, chicken breast are good options as they tend to be leaner sources of good protein.   DO NOT EAT ANY FOODS ON THIS LIST THAT YOU ARE ALLERGIC TO.

## 2020-05-17 NOTE — ED Triage Notes (Signed)
Pt c/o headache and dizziness x 3-4 days. Tylenol taken 2 days ago with minor relief

## 2020-05-17 NOTE — ED Provider Notes (Signed)
[place]   MRN: [id number] DOB: 08-02-1956  Subjective:   Garrett Henry is a 64 y.o. male presenting for 3 to 4-day history of intermittent dizziness, frontal headache.  Had one episode of slightly blurred vision on Monday.  Has used Tylenol with minimal relief.  Patient denies history of chronic conditions except for having kidney stone last year.  He does have a PCP but has not gotten consistent follow-up with them.  Has never been told that he has high blood pressure before.  Denies confusion, weakness, chest pain, shortness of breath, nausea, vomiting, belly pain, hematuria, dysuria, urinary frequency.  States that he does not like to drink more than 1 or 2 bottles of water per day because he ends up having to urinate shortly thereafter.  Denies known family history of diabetes or high blood pressure, states that his family lives [place] and does not talk with him much.  Denies polydipsia, polyuria, numbness or tingling of the hands and feet, skin infections that will not heal.  No current facility-administered medications for this encounter. No current outpatient medications on file.   Allergies  Allergen Reactions  . Amoxicillin     Pressure in face    Past Medical History:  Diagnosis Date  . Acute bronchitis   . Bronchitis   . Cough   . GERD (gastroesophageal reflux disease)   . PNA (pneumonia)      Past Surgical History:  Procedure Laterality Date  . LUNG REMOVAL, PARTIAL Left     Family History  Problem Relation Age of Onset  . Stroke Father   . Colon cancer Neg Hx     Social History   Tobacco Use  . Smoking status: Former Smoker    Packs/day: 0.50    Years: 20.00    Pack years: 10.00    Types: Cigarettes    Quit date: 10/22/1993    Years since quitting: 26.5  . Smokeless tobacco: Never Used  . Tobacco comment: smoked tobacco until 1992, then started smoking cigerettes.  Substance Use Topics  . Alcohol use: No    Alcohol/week: 0.0 standard drinks    . Drug use: No    ROS   Objective:   Vitals: BP (!) 165/87   Pulse 72   Temp 98.2 F (36.8 C)   Resp 16   SpO2 98%   BP Readings from Last 3 Encounters:  05/17/20 (!) 165/87  10/09/18 (!) 145/73  07/07/18 121/64   BP recheck 151/75 on left arm.  Physical Exam Constitutional:      General: He is not in acute distress.    Appearance: Normal appearance. He is well-developed. He is not ill-appearing, toxic-appearing or diaphoretic.  HENT:     Head: Normocephalic and atraumatic.     Right Ear: External ear normal.     Left Ear: External ear normal.     Nose: Nose normal.     Mouth/Throat:     Mouth: Mucous membranes are moist.     Pharynx: Oropharynx is clear.  Eyes:     General: No scleral icterus.       Right eye: No discharge.        Left eye: No discharge.     Extraocular Movements: Extraocular movements intact.     Conjunctiva/sclera: Conjunctivae normal.     Pupils: Pupils are equal, round, and reactive to light.  Cardiovascular:     Rate and Rhythm: Normal rate and regular rhythm.     Heart sounds: Normal heart  sounds. No murmur heard.  No friction rub. No gallop.   Pulmonary:     Effort: Pulmonary effort is normal. No respiratory distress.     Breath sounds: Normal breath sounds. No stridor. No wheezing, rhonchi or rales.  Skin:    General: Skin is warm and dry.  Neurological:     Mental Status: He is alert and oriented to person, place, and time.     Cranial Nerves: No cranial nerve deficit.     Motor: No weakness.     Coordination: Romberg sign negative. Coordination normal.     Gait: Gait normal.  Psychiatric:        Mood and Affect: Mood normal.        Behavior: Behavior normal.        Thought Content: Thought content normal.        Judgment: Judgment normal.      Assessment and Plan :   PDMP not reviewed this encounter.  1. Essential hypertension   2. Dizziness   3. Tension headache     Physical exam findings reassuring.  Patient is  to start lisinopril for management of hypertension.  Emphasized need to hydrate well each day with adequate water intake.  Recommended follow-up with his current PCP or establishing care with anyone to [facility], information provided to Dr. Carlota Raspberry. Counseled patient on potential for adverse effects with medications prescribed/recommended today, ER and return-to-clinic precautions discussed, patient verbalized understanding.    Jaynee Eagles, [place] 05/17/20 1116

## 2020-06-27 ENCOUNTER — Ambulatory Visit (HOSPITAL_COMMUNITY)
Admission: EM | Admit: 2020-06-27 | Discharge: 2020-06-27 | Disposition: A | Discharge status: Home / Self Care | Payer: 59 | Attending: Family Medicine | Admitting: Family Medicine

## 2020-06-27 ENCOUNTER — Other Ambulatory Visit: Payer: Self-pay

## 2020-06-27 ENCOUNTER — Encounter (HOSPITAL_COMMUNITY): Payer: Self-pay

## 2020-06-27 DIAGNOSIS — K219 Gastro-esophageal reflux disease without esophagitis: Secondary | ICD-10-CM

## 2020-06-27 HISTORY — DX: Essential (primary) hypertension: I10

## 2020-06-27 MED ORDER — ALUM & MAG HYDROXIDE-SIMETH 200-200-20 MG/5ML PO SUSP
30.0000 mL | Freq: Once | ORAL | Status: AC
  Administered 2020-06-27: 30 mL via ORAL

## 2020-06-27 MED ORDER — LIDOCAINE VISCOUS HCL 2 % MT SOLN
OROMUCOSAL | Status: AC
  Filled 2020-06-27: qty 15

## 2020-06-27 MED ORDER — LIDOCAINE VISCOUS HCL 2 % MT SOLN
15.0000 mL | Freq: Once | OROMUCOSAL | Status: AC
  Administered 2020-06-27: 15 mL via ORAL

## 2020-06-27 MED ORDER — OMEPRAZOLE 20 MG PO CPDR: 20.0000 mg | DELAYED_RELEASE_CAPSULE | Freq: Two times a day (BID) | ORAL | 0 refills | Status: DC

## 2020-06-27 MED ORDER — ALUM & MAG HYDROXIDE-SIMETH 200-200-20 MG/5ML PO SUSP
ORAL | Status: AC
  Filled 2020-06-27: qty 30

## 2020-06-27 NOTE — Discharge Instructions (Addendum)
I believe your symptoms are associated with acid reflux.  We are going to try omeprazole 20 mg twice  daily  Make sure that you take the omeprazole 30- 60 minutes prior to a meal with a glass of water.  Avoid spicy, greasy foods, caffeine, chocolate and milk products.  No eating 2-3 hours before bedtime. Elevate the head of the bed 30 degrees.  Try this for a few weeks to see if this improves your symptoms.  If you don't see any improvement or your symptoms worsen please follow up with a GI

## 2020-06-27 NOTE — ED Triage Notes (Signed)
Pt c/o acid reflux that has gotten worse over the past 2 wks. Pt states hard to sleep at night due to the acid coming up to his throat. Pt states he's spitting up the white sputum  And make his throat itch. He states his throat burns some times, but not all the time.

## 2020-06-27 NOTE — ED Provider Notes (Signed)
[place]    CSN: [id number] Arrival date & time: 06/27/20  7106      History   Chief Complaint Chief Complaint  Patient presents with  . Gastroesophageal Reflux    HPI Garrett Henry is a 64 y.o. male.   Patient is a 64 year old male with past medical history of bronchitis, cough, GERD, hypertension, pneumonia.  He presents today with approximately 2 weeks of worsening acid reflux.  States hard to sleep at night due to the acid coming up into his throat and feels like he is choking at times.  Stating some itching in throat area and discomfort at times.  No chest pain or shortness of breath.  Has not taken anything for his symptoms does a lot of spicy food but has decreased this over the past 2 weeks.  No abdominal pain, nausea, vomiting or diarrhea.     Past Medical History:  Diagnosis Date  . Acute bronchitis   . Bronchitis   . Cough   . GERD (gastroesophageal reflux disease)   . Hypertension   . PNA (pneumonia)     Patient Active Problem List   Diagnosis Date Noted  . Restrictive lung disease 05/23/2016  . GERD (gastroesophageal reflux disease) 05/23/2016    Past Surgical History:  Procedure Laterality Date  . LUNG REMOVAL, PARTIAL Left        Home Medications    Prior to Admission medications   Medication Sig Start Date End Date Taking? Authorizing Provider  lisinopril (ZESTRIL) 10 MG tablet Take 1 tablet (10 mg total) by mouth daily. 05/17/20   Jaynee Eagles, PA-C  omeprazole (PRILOSEC) 20 MG capsule Take 1 capsule (20 mg total) by mouth 2 (two) times daily before a meal. 06/27/20 07/27/20  Orvan July, NP    Family History Family History  Problem Relation Age of Onset  . Stroke Father   . Colon cancer Neg Hx     Social History Social History   Tobacco Use  . Smoking status: Former Smoker    Packs/day: 0.50    Years: 20.00    Pack years: 10.00    Types: Cigarettes    Quit date: 10/22/1993    Years since quitting: 26.6  . Smokeless  tobacco: Never Used  . Tobacco comment: smoked tobacco until 1992, then started smoking cigerettes.  Substance Use Topics  . Alcohol use: No    Alcohol/week: 0.0 standard drinks  . Drug use: No     Allergies   Amoxicillin   Review of Systems Review of Systems   Physical Exam Triage Vital Signs ED Triage Vitals [06/27/20 0949]  Enc Vitals Group     BP 132/61     Pulse Rate (!) 58     Resp 18     Temp 98.8 F (37.1 C)     Temp Source Oral     SpO2 99 %     Weight 165 lb (74.8 kg)     Height 5' 4.5" (1.638 m)     Head Circumference      Peak Flow      Pain Score 0     Pain Loc      Pain Edu?      Excl. in [place]?    No data found.  Updated Vital Signs BP 132/61   Pulse (!) 58   Temp 98.8 F (37.1 C) (Oral)   Resp 18   Ht 5' 4.5" (1.638 m)   Wt 165 lb (74.8 kg)  SpO2 99%   BMI 27.88 kg/m   Visual Acuity Right Eye Distance:   Left Eye Distance:   Bilateral Distance:    Right Eye Near:   Left Eye Near:    Bilateral Near:     Physical Exam Vitals and nursing note reviewed.  Constitutional:      Appearance: Normal appearance.  HENT:     Head: Normocephalic and atraumatic.     Nose: Nose normal.  Eyes:     Conjunctiva/sclera: Conjunctivae normal.  Cardiovascular:     Rate and Rhythm: Normal rate and regular rhythm.  Pulmonary:     Effort: Pulmonary effort is normal.     Breath sounds: Normal breath sounds.  Musculoskeletal:        General: Normal range of motion.     Cervical back: Normal range of motion.  Skin:    General: Skin is warm and dry.  Neurological:     Mental Status: He is alert.  Psychiatric:        Mood and Affect: Mood normal.      UC Treatments / Results  Labs (all labs ordered are listed, but only abnormal results are displayed) Labs Reviewed - No data to display  EKG   Radiology No results found.  Procedures Procedures (including critical care time)  Medications Ordered in UC Medications  alum & mag  hydroxide-simeth (MAALOX/MYLANTA) 200-200-20 MG/5ML suspension 30 mL (30 mLs Oral Given 06/27/20 1020)    And  lidocaine (XYLOCAINE) 2 % viscous mouth solution 15 mL (15 mLs Oral Given 06/27/20 1020)    Initial Impression / Assessment and Plan / UC Course  I have reviewed the triage vital signs and the nursing notes.  Pertinent labs & imaging results that were available during my care of the patient were reviewed by me and considered in my medical decision making (see chart for details).     GERD Most likely acid reflux based on symptoms. GI cocktail given here in clinic.  Will start him on omeprazole twice a day Diet precautions given. Able to referral put in for GI specialist. Follow up as needed for continued or worsening symptoms  Final Clinical Impressions(s) / UC Diagnoses   Final diagnoses:  Gastroesophageal reflux disease without esophagitis     Discharge Instructions     I believe your symptoms are associated with acid reflux.  We are going to try omeprazole 20 mg twice  daily  Make sure that you take the omeprazole 30- 60 minutes prior to a meal with a glass of water.  Avoid spicy, greasy foods, caffeine, chocolate and milk products.  No eating 2-3 hours before bedtime. Elevate the head of the bed 30 degrees.  Try this for a few weeks to see if this improves your symptoms.  If you don't see any improvement or your symptoms worsen please follow up with a GI      ED Prescriptions    Medication Sig Dispense Auth. Provider   omeprazole (PRILOSEC) 20 MG capsule Take 1 capsule (20 mg total) by mouth 2 (two) times daily before a meal. 60 capsule Jasiah Buntin A, NP     PDMP not reviewed this encounter.   Loura Halt A, NP 06/27/20 1042

## 2020-08-22 ENCOUNTER — Telehealth (HOSPITAL_COMMUNITY): Payer: Self-pay | Admitting: Family Medicine

## 2020-08-22 ENCOUNTER — Other Ambulatory Visit: Payer: Self-pay

## 2020-08-22 ENCOUNTER — Ambulatory Visit (HOSPITAL_COMMUNITY)
Admission: EM | Admit: 2020-08-22 | Discharge: 2020-08-22 | Disposition: A | Discharge status: Home / Self Care | Payer: 59 | Attending: Family Medicine | Admitting: Family Medicine

## 2020-08-22 ENCOUNTER — Encounter (HOSPITAL_COMMUNITY): Payer: Self-pay | Admitting: Emergency Medicine

## 2020-08-22 DIAGNOSIS — R059 Cough, unspecified: Secondary | ICD-10-CM

## 2020-08-22 DIAGNOSIS — K219 Gastro-esophageal reflux disease without esophagitis: Secondary | ICD-10-CM

## 2020-08-22 MED ORDER — PROMETHAZINE-DM 6.25-15 MG/5ML PO SYRP: 5.0000 mL | ORAL_SOLUTION | Freq: Four times a day (QID) | ORAL | 0 refills | Status: DC | PRN

## 2020-08-22 MED ORDER — HYDROCODONE-HOMATROPINE 5-1.5 MG/5ML PO SYRP: 5.0000 mL | ORAL_SOLUTION | Freq: Four times a day (QID) | ORAL | 0 refills | Status: DC | PRN

## 2020-08-22 MED ORDER — PANTOPRAZOLE SODIUM 20 MG PO TBEC: 20.0000 mg | DELAYED_RELEASE_TABLET | Freq: Two times a day (BID) | ORAL | 2 refills | Status: DC

## 2020-08-22 MED ORDER — LISINOPRIL 10 MG PO TABS: 10.0000 mg | ORAL_TABLET | Freq: Every day | ORAL | 0 refills | Status: DC

## 2020-08-22 NOTE — Discharge Instructions (Addendum)
We will start you on a new medicine called pantoprazole.  Take this twice a day. Refilled your blood pressure medication.  I have referred you to a gastroenterology specialist. I have also put to contact on your discharge instructions for primary care for follow-up.

## 2020-08-22 NOTE — Telephone Encounter (Signed)
Sending medicine to new pharmacy

## 2020-08-22 NOTE — ED Provider Notes (Signed)
[place]    CSN: [id number] Arrival date & time: 08/22/20  2536      History   Chief Complaint Chief Complaint  Patient presents with  . Cough    HPI Garrett Henry is a 64 y.o. male.   Patient is a 64 year old male with past medical history of bronchitis, cough, GERD, hypertension, pneumonia.  This is been present x1 week.  Attributes this to his acid reflux.  Reports it is keeping him up at night.  Reporting a lot of mucus when he just laid down.  No associated fever, chills, shortness of breath.  Has been taking omeprazole without much relief.  Also requesting refill on blood pressure medication.  Denies any history of seasonal allergies.     Past Medical History:  Diagnosis Date  . Acute bronchitis   . Bronchitis   . Cough   . GERD (gastroesophageal reflux disease)   . Hypertension   . PNA (pneumonia)     Patient Active Problem List   Diagnosis Date Noted  . Restrictive lung disease 05/23/2016  . GERD (gastroesophageal reflux disease) 05/23/2016    Past Surgical History:  Procedure Laterality Date  . LUNG REMOVAL, PARTIAL Left        Home Medications    Prior to Admission medications   Medication Sig Start Date End Date Taking? Authorizing Provider  HYDROcodone-homatropine (HYCODAN) 5-1.5 MG/5ML syrup Take 5 mLs by mouth every 6 (six) hours as needed for cough. 08/22/20   Garreth Burnsworth, Tressia Miners A, NP  lisinopril (ZESTRIL) 10 MG tablet Take 1 tablet (10 mg total) by mouth daily. 08/22/20   Aundrea Horace, Tressia Miners A, NP  pantoprazole (PROTONIX) 20 MG tablet Take 1 tablet (20 mg total) by mouth 2 (two) times daily. 08/22/20   Loura Halt A, NP  omeprazole (PRILOSEC) 20 MG capsule Take 1 capsule (20 mg total) by mouth 2 (two) times daily before a meal. 06/27/20 08/22/20  Orvan July, NP    Family History Family History  Problem Relation Age of Onset  . Stroke Father   . Colon cancer Neg Hx     Social History Social History   Tobacco Use  . Smoking status: Former  Smoker    Packs/day: 0.50    Years: 20.00    Pack years: 10.00    Types: Cigarettes    Quit date: 10/22/1993    Years since quitting: 26.8  . Smokeless tobacco: Never Used  . Tobacco comment: smoked tobacco until 1992, then started smoking cigerettes.  Substance Use Topics  . Alcohol use: No    Alcohol/week: 0.0 standard drinks  . Drug use: No     Allergies   Amoxicillin   Review of Systems Review of Systems   Physical Exam Triage Vital Signs ED Triage Vitals  Enc Vitals Group     BP 08/22/20 1032 (!) 152/64     Pulse Rate 08/22/20 1032 63     Resp 08/22/20 1032 19     Temp 08/22/20 1032 98.3 F (36.8 C)     Temp Source 08/22/20 1032 Oral     SpO2 08/22/20 1032 100 %     Weight --      Height --      Head Circumference --      Peak Flow --      Pain Score 08/22/20 1029 0     Pain Loc --      Pain Edu? --      Excl. in [place]? --  No data found.  Updated Vital Signs BP (!) 152/64 (BP Location: Left Arm)   Pulse 63   Temp 98.3 F (36.8 C) (Oral)   Resp 19   SpO2 100%   Visual Acuity Right Eye Distance:   Left Eye Distance:   Bilateral Distance:    Right Eye Near:   Left Eye Near:    Bilateral Near:     Physical Exam Vitals and nursing note reviewed.  Constitutional:      Appearance: Normal appearance.  HENT:     Head: Normocephalic and atraumatic.     Nose: Nose normal.  Eyes:     Conjunctiva/sclera: Conjunctivae normal.  Pulmonary:     Effort: Pulmonary effort is normal.  Musculoskeletal:        General: Normal range of motion.     Cervical back: Normal range of motion.  Skin:    General: Skin is warm and dry.  Neurological:     Mental Status: He is alert.  Psychiatric:        Mood and Affect: Mood normal.      UC Treatments / Results  Labs (all labs ordered are listed, but only abnormal results are displayed) Labs Reviewed - No data to display  EKG   Radiology No results found.  Procedures Procedures (including critical  care time)  Medications Ordered in UC Medications - No data to display  Initial Impression / Assessment and Plan / UC Course  I have reviewed the triage vital signs and the nursing notes.  Pertinent labs & imaging results that were available during my care of the patient were reviewed by me and considered in my medical decision making (see chart for details).     Cough Chronic cough from GERD versus  Acute URI We will change patient's PPI to Protonix to see if this helps. Referred him to gastroenterologist Refill blood pressure medicine as prescribed Hycodan at bedtime for cough as needed Contacts also given for primary care follow-up  Final Clinical Impressions(s) / UC Diagnoses   Final diagnoses:  Cough  Gastroesophageal reflux disease without esophagitis     Discharge Instructions     We will start you on a new medicine called pantoprazole.  Take this twice a day. Refilled your blood pressure medication.  I have referred you to a gastroenterology specialist. I have also put to contact on your discharge instructions for primary care for follow-up.     ED Prescriptions    Medication Sig Dispense Auth. Provider   pantoprazole (PROTONIX) 20 MG tablet Take 1 tablet (20 mg total) by mouth 2 (two) times daily. 30 tablet Riko Lumsden A, NP   lisinopril (ZESTRIL) 10 MG tablet Take 1 tablet (10 mg total) by mouth daily. 90 tablet Tiffany Talarico A, NP   HYDROcodone-homatropine (HYCODAN) 5-1.5 MG/5ML syrup Take 5 mLs by mouth every 6 (six) hours as needed for cough. 120 mL Floretta Petro A, NP     PDMP not reviewed this encounter.   Orvan July, NP 08/22/20 1122

## 2020-08-22 NOTE — Telephone Encounter (Signed)
Pt called, cough syrup on back order. Will call in phenergan DM instead.

## 2020-08-22 NOTE — ED Triage Notes (Signed)
Pt states he has had a cough x 1 week. He states he has still had acid reflux and he has been taking honey but he still has a cough. Pt states that the acid from his stomach keeps him up at night.

## 2020-09-22 ENCOUNTER — Telehealth: Payer: Self-pay | Admitting: Internal Medicine

## 2020-09-22 NOTE — Telephone Encounter (Signed)
Pt scheduled to see Nicoletta Ba PA tomorrow at 9:30am for reflux. Pt aware of appt.

## 2020-09-23 ENCOUNTER — Telehealth: Payer: Self-pay

## 2020-09-23 ENCOUNTER — Encounter: Payer: Self-pay | Admitting: Physician Assistant

## 2020-09-23 ENCOUNTER — Ambulatory Visit (INDEPENDENT_AMBULATORY_CARE_PROVIDER_SITE_OTHER): Payer: 59 | Admitting: Physician Assistant

## 2020-09-23 VITALS — BP 150/70 | HR 87 | Ht 65.0 in | Wt 158.0 lb

## 2020-09-23 DIAGNOSIS — R058 Other specified cough: Secondary | ICD-10-CM | POA: Diagnosis not present

## 2020-09-23 DIAGNOSIS — K219 Gastro-esophageal reflux disease without esophagitis: Secondary | ICD-10-CM | POA: Diagnosis not present

## 2020-09-23 MED ORDER — ESOMEPRAZOLE MAGNESIUM 40 MG PO CPDR: 40.0000 mg | DELAYED_RELEASE_CAPSULE | Freq: Two times a day (BID) | ORAL | 11 refills | Status: DC

## 2020-09-23 NOTE — Patient Instructions (Signed)
If you are age 63 or older, your body mass index should be between 23-30. Your Body mass index is 26.29 kg/m. If this is out of the aforementioned range listed, please consider follow up with your Primary Care Provider.  If you are age 77 or younger, your body mass index should be between 19-25. Your Body mass index is 26.29 kg/m. If this is out of the aformentioned range listed, please consider follow up with your Primary Care Provider.   You have been scheduled for an endoscopy. Please follow written instructions given to you at your visit today. If you use inhalers (even only as needed), please bring them with you on the day of your procedure.  STOP Pantoprazole START Nexium 40 mg 1 capsule twice daily with meals.  Start an anti-reflux regimen.  Follow up pending your Endoscopy  Thank you for entrusting me with your care and choosing [facility].  Amy Esterwood, PA-C

## 2020-09-23 NOTE — Telephone Encounter (Signed)
Prior Authorization has been started for patient's Esomeprazole.

## 2020-09-23 NOTE — Progress Notes (Signed)
Subjective:    Patient ID: Garrett Henry, male    DOB: 1956/08/11, 64 y.o.   MRN: [id number]  HPI Garrett Henry is a 64 year old male, established with Dr. Hilarie Fredrickson who was last seen in 2019 when he underwent colonoscopy with finding of 2 sessile polyps in the transverse colon 4 to 5 mm in size.  Path consistent with tubular adenomas and also noted to have scattered diverticulosis and internal hemorrhoids. Patient comes in today with progressive symptoms of GERD.  He says he has had some stomach issues longer-term but over the past 4 months he has been having ongoing issues with what he feels is acid reflux.  This has been particularly bothersome at night.  He says he has a sensation and feels mucus coming up from his stomach and esophagus into his throat.  This is triggering irritation and then coughing.  He will bring up quite a bit of mucus at times.  He has symptoms during the daytime but not as significant.  He denies any heartburn.  He is unaware of any sinus drainage.  He says he is having difficulty sleeping at times because of the coughing and mucus.  Appetite has been good, weight has been stable.  No regular NSAID use.  He denies any dysphagia or odynophagia. He has been avoiding greasy and spicy foods salty foods coffee and alcohol but continues to have significant symptoms.  He had been started on pantoprazole 20 mg p.o. twice daily by PCP but does not feel this has been effective. No prior EGD  Review of Systems Pertinent positive and negative review of systems were noted in the above HPI section.  All other review of systems was otherwise negative.  Outpatient Encounter Medications as of 09/23/2020  Medication Sig  . lisinopril (ZESTRIL) 10 MG tablet Take 1 tablet (10 mg total) by mouth daily.  . [DISCONTINUED] pantoprazole (PROTONIX) 20 MG tablet Take 1 tablet (20 mg total) by mouth 2 (two) times daily.  Marland Kitchen esomeprazole (NEXIUM) 40 MG capsule Take 1 capsule (40 mg total) by mouth 2 (two) times daily  before a meal.  . [DISCONTINUED] omeprazole (PRILOSEC) 20 MG capsule Take 1 capsule (20 mg total) by mouth 2 (two) times daily before a meal.  . [DISCONTINUED] promethazine-dextromethorphan (PROMETHAZINE-DM) 6.25-15 MG/5ML syrup Take 5 mLs by mouth 4 (four) times daily as needed for cough.   No facility-administered encounter medications on file as of 09/23/2020.   Allergies  Allergen Reactions  . Amoxicillin     Pressure in face   Patient Active Problem List   Diagnosis Date Noted  . Restrictive lung disease 05/23/2016  . GERD (gastroesophageal reflux disease) 05/23/2016   Social History   Socioeconomic History  . Marital status: Single    Spouse name: Not on file  . Number of children: 5  . Years of education: Not on file  . Highest education level: Not on file  Occupational History  . Occupation: retired  Tobacco Use  . Smoking status: Former Smoker    Packs/day: 0.50    Years: 20.00    Pack years: 10.00    Types: Cigarettes    Quit date: 10/22/1993    Years since quitting: 26.9  . Smokeless tobacco: Never Used  . Tobacco comment: smoked tobacco until 1992, then started smoking cigerettes.  Vaping Use  . Vaping Use: Never used  Substance and Sexual Activity  . Alcohol use: No    Alcohol/week: 0.0 standard drinks  . Drug use: No  .  Sexual activity: Not on file  Other Topics Concern  . Not on file  Social History Narrative   Originally from [place]. He moved to [place] from [place] in 1992. Currently doesn't work and takes care of his granddaughter. No pets currently. No bird exposure. No mold exposure. Previously did live in the [place] in [place].    Social Determinants of Health   Financial Resource Strain:   . Difficulty of Paying Living Expenses: Not on file  Food Insecurity:   . Worried About Charity fundraiser in the Last Year: Not on file  . Ran Out of Food in the Last Year: Not on file  Transportation Needs:   . Lack of Transportation (Medical): Not on file    . Lack of Transportation (Non-Medical): Not on file  Physical Activity:   . Days of Exercise per Week: Not on file  . Minutes of Exercise per Session: Not on file  Stress:   . Feeling of Stress : Not on file  Social Connections:   . Frequency of Communication with Friends and Family: Not on file  . Frequency of Social Gatherings with Friends and Family: Not on file  . Attends Religious Services: Not on file  . Active Member of Clubs or Organizations: Not on file  . Attends Archivist Meetings: Not on file  . Marital Status: Not on file  Intimate Partner Violence:   . Fear of Current or Ex-Partner: Not on file  . Emotionally Abused: Not on file  . Physically Abused: Not on file  . Sexually Abused: Not on file    Mr. Matsuo family history includes Stroke in his father.      Objective:    Vitals:   09/23/20 0923  BP: (!) 150/70  Pulse: 87    Physical Exam Well-developed well-nourished older Asian male l in no acute distress.  Height, [id number], BMI 26.9  HEENT; nontraumatic normocephalic, EOMI, PER R LA, sclera anicteric. Oropharynx; Neck; supple, no JVD Cardiovascular; regular rate and rhythm with S1-S2, no murmur rub or gallop Pulmonary; Clear bilaterally Abdomen; soft, nontender, nondistended, no palpable mass or hepatosplenomegaly, bowel sounds are active Rectal;not done Skin; benign exam, no jaundice rash or appreciable lesions Extremities; no clubbing cyanosis or edema skin warm and dry Neuro/Psych; alert and oriented x4, grossly nonfocal mood and affect appropriate       Assessment & Plan:   #57 64 year old Asian male with 4 to 74-month history of persistent daily symptoms of what sounds like acid reflux with reflux of mucus inducing frequent coughing particularly at nighttime.  Patient is also having daytime symptoms though not as severe.  No heartburn, and no dysphagia, no weight loss.  No shortness of breath  Rule out refractory GERD, rule out con  commitment chronic pulmonary disease  #2 history of adenomatous colon polyps-up-to-date with colonoscopy last done 2019 and due for 5-year interval follow-up 2024 #3 diverticulosis 4.  Internal hemorrhoids  Plan; strict antireflux regimen and antireflux diet.  We discussed elevation of the head of the bed and back at least 45 degrees while sleeping.  Patient has been sleeping in a recliner some as he has been more comfortable. Stop pantoprazole 20 mg and switch to Nexium 40 mg p.o. AC breakfast and AC dinner. Patient will be scheduled for EGD with Dr. Hilarie Fredrickson.  Procedure was discussed in detail with the patient including indications, risks and benefits and he is agreeable to proceed. He has completed COVID-19 vaccination. Further recommendation pending response to  twice daily Nexium and results of EGD. If mucus production continues to be  A significant problem may need pulmonary consultation  Ayomide Purdy Genia Harold PA-C 09/23/2020   Cc: Everardo Beals, NP

## 2020-09-26 ENCOUNTER — Telehealth: Payer: Self-pay | Admitting: Internal Medicine

## 2020-09-26 NOTE — Progress Notes (Signed)
Addendum: Reviewed and agree with assessment and management plan. Melanee Cordial M, MD  

## 2020-09-26 NOTE — Telephone Encounter (Signed)
Patient's prescription is currently awaiting approval from insurance company.

## 2020-09-27 NOTE — Telephone Encounter (Signed)
Fax more information to [facility]

## 2020-09-29 ENCOUNTER — Encounter: Payer: Self-pay | Admitting: Internal Medicine

## 2020-09-29 ENCOUNTER — Ambulatory Visit (AMBULATORY_SURGERY_CENTER): Payer: 59 | Admitting: Internal Medicine

## 2020-09-29 ENCOUNTER — Other Ambulatory Visit: Payer: Self-pay

## 2020-09-29 VITALS — BP 120/68 | HR 73 | Temp 97.3°F | Resp 12 | Ht 65.0 in | Wt 158.0 lb

## 2020-09-29 DIAGNOSIS — K2970 Gastritis, unspecified, without bleeding: Secondary | ICD-10-CM

## 2020-09-29 DIAGNOSIS — K219 Gastro-esophageal reflux disease without esophagitis: Secondary | ICD-10-CM

## 2020-09-29 MED ORDER — ESOMEPRAZOLE MAGNESIUM 40 MG PO CPDR: 40.0000 mg | DELAYED_RELEASE_CAPSULE | Freq: Every morning | ORAL | 3 refills | Status: DC

## 2020-09-29 MED ORDER — SODIUM CHLORIDE 0.9 % IV SOLN: 500.0000 mL | Freq: Once | INTRAVENOUS | Status: DC

## 2020-09-29 NOTE — Op Note (Signed)
[place] Patient Name: Garrett Henry Procedure Date: 09/29/2020 2:35 PM MRN: [id number] Endoscopist: Jerene Bears , MD Age: 64 Referring MD:  Date of Birth: [birth year] Gender: Male Account #: [id number] Procedure:                Upper GI endoscopy Indications:              Gastro-esophageal reflux disease Medicines:                Monitored Anesthesia Care Procedure:                Pre-Anesthesia Assessment:                           - Prior to the procedure, a History and Physical                            was performed, and patient medications and                            allergies were reviewed. The patient's tolerance of                            previous anesthesia was also reviewed. The risks                            and benefits of the procedure and the sedation                            options and risks were discussed with the patient.                            All questions were answered, and informed consent                            was obtained. Prior Anticoagulants: The patient has                            taken no previous anticoagulant or antiplatelet                            agents. ASA Grade Assessment: II - A patient with                            mild systemic disease. After reviewing the risks                            and benefits, the patient was deemed in                            satisfactory condition to undergo the procedure.                           After obtaining informed consent, the endoscope was  passed under direct vision. Throughout the                            procedure, the patient's blood pressure, pulse, and                            oxygen saturations were monitored continuously. The                            Endoscope was introduced through the mouth, and                            advanced to the second part of duodenum. The upper                            GI endoscopy was accomplished  without difficulty.                            The patient tolerated the procedure well. Scope In: Scope Out: Findings:                 The examined esophagus was normal.                           Localized mild inflammation characterized by                            erythema was found in the gastric body. Biopsies                            were taken with a cold forceps for histology and                            Helicobacter pylori testing (gastric body +                            antrum/incisura).                           The cardia and gastric fundus were normal on                            retroflexion.                           The examined duodenum was normal. Complications:            No immediate complications. Estimated Blood Loss:     Estimated blood loss was minimal. Impression:               - Normal esophagus.                           - Gastritis. Biopsied.                           - Normal examined duodenum. Recommendation:           -  Patient has a contact number available for                            emergencies. The signs and symptoms of potential                            delayed complications were discussed with the                            patient. Return to normal activities tomorrow.                            Written discharge instructions were provided to the                            patient.                           - Resume previous diet.                           - Continue present medications. Change back to                            Nexium 40 mg daily as recommended by Nicoletta Ba,                            PAC when he was recently seen. He never received                            this prescription. If symptoms, not better then                            office followup appointment is recommended.                           - Await pathology results. Jerene Bears, MD 09/29/2020 2:48:58 PM This report has been signed electronically.

## 2020-09-29 NOTE — Patient Instructions (Signed)
Please read handouts provided. Continue present medications. Nexium 40 mg daily. If symptoms do not improve, follow-up at GI office. Await pathology results.      YOU HAD AN ENDOSCOPIC PROCEDURE TODAY AT [facility]:   Refer to the procedure report that was given to you for any specific questions about what was found during the examination.  If the procedure report does not answer your questions, please call your gastroenterologist to clarify.  If you requested that your care partner not be given the details of your procedure findings, then the procedure report has been included in a sealed envelope for you to review at your convenience later.  YOU SHOULD EXPECT: Some feelings of bloating in the abdomen. Passage of more gas than usual.  Walking can help get rid of the air that was put into your GI tract during the procedure and reduce the bloating. If you had a lower endoscopy (such as a colonoscopy or flexible sigmoidoscopy) you may notice spotting of blood in your stool or on the toilet paper. If you underwent a bowel prep for your procedure, you may not have a normal bowel movement for a few days.  Please Note:  You might notice some irritation and congestion in your nose or some drainage.  This is from the oxygen used during your procedure.  There is no need for concern and it should clear up in a day or so.  SYMPTOMS TO REPORT IMMEDIATELY:    Following upper endoscopy (EGD)  Vomiting of blood or coffee ground material  New chest pain or pain under the shoulder blades  Painful or persistently difficult swallowing  New shortness of breath  Fever of 100F or higher  Black, tarry-looking stools  For urgent or emergent issues, a gastroenterologist can be reached at any hour by calling [phone]. Do not use MyChart messaging for urgent concerns.    DIET:  We do recommend a small meal at first, but then you may proceed to your regular diet.  Drink plenty of fluids  but you should avoid alcoholic beverages for 24 hours.  ACTIVITY:  You should plan to take it easy for the rest of today and you should NOT DRIVE or use heavy machinery until tomorrow (because of the sedation medicines used during the test).    FOLLOW UP: Our staff will call the number listed on your records 48-72 hours following your procedure to check on you and address any questions or concerns that you may have regarding the information given to you following your procedure. If we do not reach you, we will leave a message.  We will attempt to reach you two times.  During this call, we will ask if you have developed any symptoms of COVID 19. If you develop any symptoms (ie: fever, flu-like symptoms, shortness of breath, cough etc.) before then, please call [phone].  If you test positive for Covid 19 in the 2 weeks post procedure, please call and report this information to [place].    If any biopsies were taken you will be contacted by phone or by letter within the next 1-3 weeks.  Please call us at [phone] if you have not heard about the biopsies in 3 weeks.    SIGNATURES/CONFIDENTIALITY: You and/or your care partner have signed paperwork which will be entered into your electronic medical record.  These signatures attest to the fact that that the information above on your After Visit Summary has been reviewed and is understood.  Full responsibility of the confidentiality of this discharge information lies with you and/or your care-partner.

## 2020-09-29 NOTE — Progress Notes (Signed)
Called to room to assist during endoscopic procedure.  Patient ID and intended procedure confirmed with present staff. Received instructions for my participation in the procedure from the performing physician.  

## 2020-09-29 NOTE — Progress Notes (Signed)
1435 Robinul 0.1 mg IV given due large amount of secretions upon assessment.  MD made aware, vss 

## 2020-09-29 NOTE — Progress Notes (Signed)
Vitals-CW  History reviewed. 

## 2020-09-29 NOTE — Progress Notes (Signed)
Report given to PACU, vss 

## 2020-09-30 NOTE — Telephone Encounter (Signed)
Patient's medication has been approved from 09/29/2020 to 09/29/2021

## 2020-09-30 NOTE — Telephone Encounter (Signed)
Received approval from patient's insurance

## 2020-10-03 ENCOUNTER — Telehealth: Payer: Self-pay | Admitting: *Deleted

## 2020-10-03 NOTE — Telephone Encounter (Signed)
1. Have you developed a fever since your procedure? no  2.   Have you had an respiratory symptoms (SOB or cough) since your procedure? no  3.   Have you tested positive for COVID 19 since your procedure no  4.   Have you had any family members/close contacts diagnosed with the COVID 19 since your procedure?  no   If yes to any of these questions please route to Joylene John, RN and Joella Prince, RN Follow up Call-  Call back number 09/29/2020 07/07/2018  Post procedure Call Back phone  # [phone]  Permission to leave phone message Yes Yes  Some recent data might be hidden     Patient questions:  Do you have a fever, pain , or abdominal swelling? No. Pain Score  0 *  Have you tolerated food without any problems? Yes.    Have you been able to return to your normal activities? Yes.    Do you have any questions about your discharge instructions: Diet   No. Medications  No. Follow up visit  No.  Do you have questions or concerns about your Care? No.  Actions: * If pain score is 4 or above: No action needed, pain <4.

## 2020-10-06 ENCOUNTER — Encounter: Payer: Self-pay | Admitting: Internal Medicine

## 2020-10-28 ENCOUNTER — Telehealth: Payer: Self-pay | Admitting: Internal Medicine

## 2020-10-28 MED ORDER — FAMOTIDINE 40 MG PO TABS: 40.0000 mg | ORAL_TABLET | Freq: Two times a day (BID) | ORAL | 1 refills | Status: DC

## 2020-10-28 NOTE — Telephone Encounter (Signed)
Given lack of response to PPI, I would try H2 blocker instead If this isn't effective we should see him back to discuss symptoms bc this may not be reflux and may warrant further testing Thanks  Famotidine 40 mg twice daily (roughly 12 hrs apart).  If not better he should let us know

## 2020-10-28 NOTE — Telephone Encounter (Signed)
Patient notified of the recommendations He will keep the previously scheduled appointment on 2/10.  New rx sent

## 2020-10-28 NOTE — Telephone Encounter (Signed)
Dr. Hilarie Fredrickson, please advise alternate PPI.  Looks like he has already tried omeprazole, pantoprazole, and Nexium

## 2020-10-28 NOTE — Telephone Encounter (Signed)
Inbound call from patient requesting different medication.  States Nexium is not working, still having issues.  Please advise.

## 2020-11-19 ENCOUNTER — Other Ambulatory Visit: Payer: Self-pay | Admitting: Internal Medicine

## 2020-11-21 ENCOUNTER — Other Ambulatory Visit: Payer: Self-pay

## 2020-11-21 ENCOUNTER — Encounter (HOSPITAL_COMMUNITY): Payer: Self-pay

## 2020-11-21 ENCOUNTER — Ambulatory Visit (HOSPITAL_COMMUNITY)
Admission: EM | Admit: 2020-11-21 | Discharge: 2020-11-21 | Disposition: A | Discharge status: Home / Self Care | Payer: 59 | Attending: Emergency Medicine | Admitting: Emergency Medicine

## 2020-11-21 DIAGNOSIS — Z760 Encounter for issue of repeat prescription: Secondary | ICD-10-CM

## 2020-11-21 DIAGNOSIS — R059 Cough, unspecified: Secondary | ICD-10-CM

## 2020-11-21 DIAGNOSIS — I10 Essential (primary) hypertension: Secondary | ICD-10-CM

## 2020-11-21 MED ORDER — LISINOPRIL 10 MG PO TABS: 10.0000 mg | ORAL_TABLET | Freq: Every day | ORAL | 0 refills | Status: DC

## 2020-11-21 NOTE — ED Provider Notes (Signed)
[place] [facility] [place]    CSN: [id number] Arrival date & time: 11/21/20  0801      History   Chief Complaint Chief Complaint  Patient presents with  . Cough  . Medication Refill    BP med refill     HPI Garrett Henry is a 65 y.o. male.   Garrett Henry presents with requests for refill of his lisinopril. He only has three pills left, but doesn't have an appointment with his PCP, plans to see them next month. Last refill was provided here at [facility]. No chest pain , no dizziness, no lower extremity swelling. Persistent cough. Worse at night. Productive of phlegm. He has been seeing GI for this with concern for gerd, has been taking prescribed medications for this with minimal relief. No shortness of breath. Some cough during the day. Denies any allergy symptoms. History of lung surgery in the past.     ROS per HPI, negative if not otherwise mentioned.      Past Medical History:  Diagnosis Date  . Acute bronchitis   . Bronchitis   . Cough   . GERD (gastroesophageal reflux disease)   . Hypertension   . PNA (pneumonia)     Patient Active Problem List   Diagnosis Date Noted  . Restrictive lung disease 05/23/2016  . GERD (gastroesophageal reflux disease) 05/23/2016    Past Surgical History:  Procedure Laterality Date  . LUNG REMOVAL, PARTIAL Left 1995       Home Medications    Prior to Admission medications   Medication Sig Start Date End Date Taking? Authorizing Provider  esomeprazole (NEXIUM) 40 MG capsule Take 1 capsule (40 mg total) by mouth every morning. Take every morning 30 minutes before breakfast. 09/29/20   Pyrtle, Lajuan Lines, MD  famotidine (PEPCID) 40 MG tablet Take 1 tablet (40 mg total) by mouth 2 (two) times daily. 10/28/20   Pyrtle, Lajuan Lines, MD  lisinopril (ZESTRIL) 10 MG tablet Take 1 tablet (10 mg total) by mouth daily. 11/21/20   Zigmund Gottron, NP  omeprazole (PRILOSEC) 20 MG capsule Take 1 capsule (20 mg total) by mouth 2 (two) times daily before a meal. 06/27/20  08/22/20  Orvan July, NP    Family History Family History  Problem Relation Age of Onset  . Stroke Father   . Colon cancer Neg Hx   . Rectal cancer Neg Hx   . Stomach cancer Neg Hx     Social History Social History   Tobacco Use  . Smoking status: Former Smoker    Packs/day: 0.50    Years: 20.00    Pack years: 10.00    Types: Cigarettes    Quit date: 10/22/1993    Years since quitting: 27.1  . Smokeless tobacco: Never Used  . Tobacco comment: smoked tobacco until 1992, then started smoking cigerettes.  Vaping Use  . Vaping Use: Never used  Substance Use Topics  . Alcohol use: No    Alcohol/week: 0.0 standard drinks  . Drug use: No     Allergies   Amoxicillin   Review of Systems Review of Systems   Physical Exam Triage Vital Signs ED Triage Vitals  Enc Vitals Group     BP 11/21/20 0816 (S) (!) 184/74     Pulse Rate 11/21/20 0816 71     Resp 11/21/20 0816 16     Temp 11/21/20 0816 98.4 F (36.9 C)     Temp Source 11/21/20 0816 Oral     SpO2  11/21/20 0816 100 %     Weight --      Height --      Head Circumference --      Peak Flow --      Pain Score 11/21/20 0813 0     Pain Loc --      Pain Edu? --      Excl. in [place]? --    No data found.  Updated Vital Signs BP (!) 144/84 (BP Location: Right Arm) Comment: Does not know baseline. He did take his BP med this am.  Pulse 71   Temp 98.4 F (36.9 C) (Oral)   Resp 16   SpO2 100%   Visual Acuity Right Eye Distance:   Left Eye Distance:   Bilateral Distance:    Right Eye Near:   Left Eye Near:    Bilateral Near:     Physical Exam Constitutional:      Appearance: He is well-developed.  Cardiovascular:     Rate and Rhythm: Normal rate.  Pulmonary:     Effort: Pulmonary effort is normal.  Skin:    General: Skin is warm and dry.  Neurological:     Mental Status: He is alert and oriented to person, place, and time.      UC Treatments / Results  Labs (all labs ordered are listed, but only  abnormal results are displayed) Labs Reviewed - No data to display  EKG   Radiology No results found.  Procedures Procedures (including critical care time)  Medications Ordered in UC Medications - No data to display  Initial Impression / Assessment and Plan / UC Course  I have reviewed the triage vital signs and the nursing notes.  Pertinent labs & imaging results that were available during my care of the patient were reviewed by me and considered in my medical decision making (see chart for details).     bp recheck much improved from initial elevated BP. Refill provided. Persistent cough. chf vs allergies vs lisinopril induced vs gerd considered. No shortness of breath , no leg swelling. Encouraged close follow up with pcp for recheck and management as needed. Return precautions provided. Patient verbalized understanding and agreeable to plan.  Ambulatory out of clinic without difficulty.    Final Clinical Impressions(s) / UC Diagnoses   Final diagnoses:  Medication refill  Hypertension, unspecified type  Cough     Discharge Instructions     Please continue with pepcid twice a day, particularly at night before bed as this may help with your cough. You may also try adding in benadryl before bed to see if this helps with your mucus.  Your blood pressure medication may be contributing to your cough, so please follow up with your primary care provided for recheck of your blood pressure as you may need your medication changed.    ED Prescriptions    Medication Sig Dispense Auth. Provider   lisinopril (ZESTRIL) 10 MG tablet Take 1 tablet (10 mg total) by mouth daily. 30 tablet Zigmund Gottron, NP     PDMP not reviewed this encounter.   Zigmund Gottron, NP 11/21/20 [phone]

## 2020-11-21 NOTE — ED Triage Notes (Signed)
Patient presents to Urgent Care with complaints of cough x 3 days ago. Pt also requesting BP med refill. He doses not see PCP until next month. Pt takes Lisinopril 10 mg daily.

## 2020-11-21 NOTE — Discharge Instructions (Signed)
Please continue with pepcid twice a day, particularly at night before bed as this may help with your cough. You may also try adding in benadryl before bed to see if this helps with your mucus.  Your blood pressure medication may be contributing to your cough, so please follow up with your primary care provided for recheck of your blood pressure as you may need your medication changed.

## 2020-11-25 ENCOUNTER — Encounter: Payer: Self-pay | Admitting: *Deleted

## 2020-12-01 ENCOUNTER — Ambulatory Visit (INDEPENDENT_AMBULATORY_CARE_PROVIDER_SITE_OTHER): Payer: 59 | Admitting: Medical

## 2020-12-01 ENCOUNTER — Encounter: Payer: Self-pay | Admitting: Medical

## 2020-12-01 ENCOUNTER — Ambulatory Visit (INDEPENDENT_AMBULATORY_CARE_PROVIDER_SITE_OTHER): Payer: 59 | Admitting: Internal Medicine

## 2020-12-01 ENCOUNTER — Other Ambulatory Visit: Payer: Self-pay

## 2020-12-01 ENCOUNTER — Encounter: Payer: Self-pay | Admitting: Internal Medicine

## 2020-12-01 ENCOUNTER — Ambulatory Visit
Admission: RE | Admit: 2020-12-01 | Discharge: 2020-12-01 | Disposition: A | Discharge status: Home / Self Care | Payer: 59 | Source: Ambulatory Visit | Attending: Medical | Admitting: Medical

## 2020-12-01 VITALS — BP 150/78 | HR 81 | Ht 65.0 in | Wt 159.4 lb

## 2020-12-01 VITALS — BP 150/82 | HR 71 | Ht 64.0 in | Wt 160.2 lb

## 2020-12-01 DIAGNOSIS — K219 Gastro-esophageal reflux disease without esophagitis: Secondary | ICD-10-CM | POA: Insufficient documentation

## 2020-12-01 DIAGNOSIS — I700 Atherosclerosis of aorta: Secondary | ICD-10-CM | POA: Insufficient documentation

## 2020-12-01 DIAGNOSIS — I10 Essential (primary) hypertension: Secondary | ICD-10-CM | POA: Diagnosis not present

## 2020-12-01 DIAGNOSIS — R059 Cough, unspecified: Secondary | ICD-10-CM | POA: Diagnosis not present

## 2020-12-01 DIAGNOSIS — R053 Chronic cough: Secondary | ICD-10-CM | POA: Insufficient documentation

## 2020-12-01 MED ORDER — HYDROCODONE-HOMATROPINE 5-1.5 MG/5ML PO SYRP: 5.0000 mL | ORAL_SOLUTION | Freq: Three times a day (TID) | ORAL | 0 refills | Status: DC | PRN

## 2020-12-01 MED ORDER — VALSARTAN 80 MG PO TABS: 80.0000 mg | ORAL_TABLET | Freq: Every day | ORAL | 2 refills | Status: DC

## 2020-12-01 MED ORDER — ESOMEPRAZOLE MAGNESIUM 40 MG PO CPDR: 40.0000 mg | DELAYED_RELEASE_CAPSULE | Freq: Every morning | ORAL | 3 refills | Status: DC

## 2020-12-01 MED ORDER — FAMOTIDINE 20 MG PO TABS: 20.0000 mg | ORAL_TABLET | Freq: Every evening | ORAL | 3 refills | Status: DC

## 2020-12-01 NOTE — Progress Notes (Signed)
   Subjective:    Patient ID: Garrett Henry, male    DOB: Jan 12, 1956, 65 y.o.   MRN: [id number]  HPI Garrett Henry is a 65 year old male with a history of GERD, adenomatous colon polyps, diverticulosis and internal hemorrhoids who is seen for follow-up.  He is here alone today and last saw Amy in the office on 10/20/2020 and then had an upper endoscopy on 09/29/2020.  Upper endoscopy on 09/29/2020 showed a normal esophagus.  There was localized mild inflammation of the gastric body which was biopsied.  The proximal stomach was normal on retroflexion as was the examined duodenum.  Pathology results showed no significant pathologic changes.  Negative for H. Pylori.  His biggest issue today is cough with phlegm and a "tickle in his throat".  His cough has become quite frequent and annoying.  He is not having chest tightness or dyspnea.  He is not having heartburn or indigestion symptoms.  He was taking Nexium and we tried him on a different medication most recently famotidine 40 mg twice daily.  He is not having abdominal pain and his bowel movements have been normal.   Review of Systems As per HPI, otherwise negative  Current Medications, Allergies, Past Medical History, Past Surgical History, Family History and Social History were reviewed in [employer] record.     Objective:   Physical Exam BP (!) 150/82   Pulse 71   Ht 5\' 4"  (1.626 m)   Wt 160 lb 3.2 oz (72.7 kg)   SpO2 97%   BMI 27.50 kg/m  Gen: awake, alert, NAD HEENT: anicteric, op clear CV: RRR, no mrg Pulm: CTA b/l Abd: soft, NT/ND, +BS throughout Ext: no c/c/e Neuro: nonfocal      Assessment & Plan:   65 year old male with a history of GERD, adenomatous colon polyps, diverticulosis and internal hemorrhoids who is seen for follow-up.   1.  Cough --with prior PPI and now high-dose twice daily H2 blocker this symptom has not improved.  His heartburn is completely controlled from a symptom standpoint.  I do not  think his cough is reflux related.  It could be ACE inhibitor related or more related to a upper airway/pulmonary issue.  I discussed this with his primary care by secure chat.  He will see Dorothea Ogle later today. --Primary care discussion regarding changing ACE inhibitor to see if cough improves --If cough continues despite changes in medical therapy then pulmonary consult recommended for chronic cough  2.  GERD --symptoms well controlled.  We have tried multiple different therapies and his heartburn is no longer an issue.  I am going to have him on the following therapy: --Nexium 40 mg in the morning, 30 minutes before breakfast --Famotidine 20 mg in the evening  3.  History of adenomatous colon polyps --he is on colonoscopic surveillance protocol.  Surveillance colonoscopy recommended September 2024 to 2026 (given guideline change recently)  20 minutes total spent today including patient facing time, coordination of care, reviewing medical history/procedures/pertinent radiology studies, and documentation of the encounter.

## 2020-12-01 NOTE — Progress Notes (Signed)
Subjective:  Garrett Henry is a 65 y.o. male who presents for Chief Complaint  Patient presents with  . New Patient (Initial Visit)    Establish care. Need refills on medication. Want to change blood pressure medication due to cough      Here to establish care.  Was seeing urgent care prior. At one point went to [facility] primary care.  Has a cough lingering for a month.  Doesn't feel sick.  No nausea, no vomiting.   Eating good, good appetite.   Feels itchy from inside.  Gets phlegm up every night.  No recent fever, no weight loss.  No sore throat, no sinus pressure.    Has hx/o partial lung resection back in 1995, not sure the actual diagnosis.   Needs some thing for cough and wants to consider changing BP medication.  Has hx/o smoking but quit in 1995.    Retired.  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  No other aggravating or relieving factors.    No other c/o.  The following portions of the patient's history were reviewed and updated as appropriate: allergies, current medications, past family history, past medical history, past social history, past surgical history and problem list.  ROS Otherwise as in subjective above    Objective: BP (!) 150/78   Pulse 81   Ht 5\' 5"  (1.651 m)   Wt 159 lb 6.4 oz (72.3 kg)   SpO2 97%   BMI 26.53 kg/m   General appearance: alert, no distress, well developed, well nourished, [place] American male HEENT: normocephalic, sclerae anicteric, conjunctiva pink and moist, TMs pearly, nares patent, no discharge or erythema, pharynx normal Oral cavity: MMM, no lesions Neck: supple, no lymphadenopathy, no thyromegaly, no masses Heart: RRR, normal S1, S2, no murmurs Lungs: CTA bilaterally, no wheezes, rhonchi, or rales Abdomen: +bs, soft, non tender, non distended, no masses, no hepatomegaly, no splenomegaly Pulses: 2+ radial pulses, 2+ pedal pulses, normal cap refill Ext: no  edema   Assessment: Encounter Diagnoses  Name Primary?  . Chronic cough Yes  . Gastroesophageal reflux disease, unspecified whether esophagitis present   . Aortic atherosclerosis ([place])   . Essential hypertension, benign      Plan: Discussed concerns and symptoms.  Discussed differential for cough.  Begin hycodan as needed for cough, change to Diovan in the event of likely ACE induced cough on Lisinopril.     Will send for chest xray.  C/t GERD medicaiton per GI.  His gastroenterologist contacted me this morning about the visit.   We discussed finding of aortic atherosclerosis on 2020 CT abdomen.  Discussed significant of this.  He declines Crestor /statin today , but spent some time discussing this.      [name] was seen today for new patient (initial visit).  Diagnoses and all orders for this visit:  Chronic cough -     DG Chest 2 View; Future  Gastroesophageal reflux disease, unspecified whether esophagitis present  Aortic atherosclerosis ([facility])  Essential hypertension, benign  Other orders -     valsartan (DIOVAN) 80 MG tablet; Take 1 tablet (80 mg total) by mouth daily. -     HYDROcodone-homatropine (HYCODAN) 5-1.5 MG/5ML syrup; Take 5 mLs by mouth every 8 (eight) hours as needed for up to 5 days.    Follow up: pending chest xray, 27mo

## 2020-12-01 NOTE — Patient Instructions (Signed)
We have sent the following medications to your pharmacy for you to pick up at your convenience: Nexium 40 mg every morning, 30 minutes before breakfast. Pepcid (famotidine) 20 mg every evening-- this is a change from your previous dosing.  If you are age 64 or older, your body mass index should be between 23-30. Your Body mass index is 27.5 kg/m. If this is out of the aforementioned range listed, please consider follow up with your Primary Care Provider.  If you are age 48 or younger, your body mass index should be between 19-25. Your Body mass index is 27.5 kg/m. If this is out of the aformentioned range listed, please consider follow up with your Primary Care Provider.   Due to recent changes in healthcare laws, you may see the results of your imaging and laboratory studies on MyChart before your provider has had a chance to review them.  We understand that in some cases there may be results that are confusing or concerning to you. Not all laboratory results come back in the same time frame and the provider may be waiting for multiple results in order to interpret others.  Please give [place] 48 hours in order for your provider to thoroughly review all the results before contacting the office for clarification of your results.

## 2020-12-01 NOTE — Patient Instructions (Signed)
Please go to [facility] for your chest xray.   Their hours are 8am - 4:30 pm Monday - Friday.  Take your insurance card with you.  [facility] [phone]  [address], [place],  [id number]  [address] [place], [place] [address]    Cough, Adult A cough helps to clear your throat and lungs. A cough may be a sign of an illness or another medical condition. An acute cough may only last 2-3 weeks, while a chronic cough may last 8 or more weeks. Many things can cause a cough. They include:  Germs (viruses or bacteria) that attack the airway.  Breathing in things that bother (irritate) your lungs.  Allergies.  Asthma.  Mucus that runs down the back of your throat (postnasal drip).  Smoking.  Acid backing up from the stomach into the tube that moves food from the mouth to the stomach (gastroesophageal reflux).  Some medicines.  Lung problems.  Other medical conditions, such as heart failure or a blood clot in the lung (pulmonary embolism). Follow these instructions at home: Medicines  Take over-the-counter and prescription medicines only as told by your doctor.  Talk with your doctor before you take medicines that stop a cough (cough suppressants). Lifestyle  Do not smoke, and try not to be around smoke. Do not use any products that contain nicotine or tobacco, such as cigarettes, e-cigarettes, and chewing tobacco. If you need help quitting, ask your doctor.  Drink enough fluid to keep your pee (urine) pale yellow.  Avoid caffeine.  Do not drink alcohol if your doctor tells you not to drink.   General instructions  Watch for any changes in your cough. Tell your doctor about them.  Always cover your mouth when you cough.  Stay away from things that make you cough, such as perfume, candles, campfire smoke, or cleaning products.  If the air is dry, use a cool mist vaporizer or humidifier in your home.  If your cough is worse at  night, try using extra pillows to raise your head up higher while you sleep.  Rest as needed.  Keep all follow-up visits as told by your doctor. This is important.   Contact a doctor if:  You have new symptoms.  You cough up pus.  Your cough does not get better after 2-3 weeks, or your cough gets worse.  Cough medicine does not help your cough and you are not sleeping well.  You have pain that gets worse or pain that is not helped with medicine.  You have a fever.  You are losing weight and you do not know why.  You have night sweats. Get help right away if:  You cough up blood.  You have trouble breathing.  Your heartbeat is very fast. These symptoms may be an emergency. Do not wait to see if the symptoms will go away. Get medical help right away. Call your local emergency services (911 in the [place] Do not drive yourself to the hospital. Summary  A cough helps to clear your throat and lungs. Many things can cause a cough.  Take over-the-counter and prescription medicines only as told by your doctor.  Always cover your mouth when you cough.  Contact a doctor if you have new symptoms or you have a cough that does not get better or gets worse. This information is not intended to replace advice given to you by your health care provider. Make sure you discuss any questions you  have with your health care provider. Document Revised: 11/27/2019 Document Reviewed: 10/27/2018 Elsevier Patient Education  [place].

## 2020-12-02 ENCOUNTER — Other Ambulatory Visit: Payer: Self-pay | Admitting: Medical

## 2020-12-02 MED ORDER — PROMETHAZINE-CODEINE 6.25-10 MG/5ML PO SYRP: 5.0000 mL | ORAL_SOLUTION | Freq: Four times a day (QID) | ORAL | 0 refills | Status: DC | PRN

## 2020-12-13 ENCOUNTER — Other Ambulatory Visit: Payer: Self-pay | Admitting: Internal Medicine

## 2021-01-23 ENCOUNTER — Encounter: Payer: Self-pay | Admitting: Medical

## 2021-01-23 ENCOUNTER — Ambulatory Visit (INDEPENDENT_AMBULATORY_CARE_PROVIDER_SITE_OTHER): Payer: 59 | Admitting: Medical

## 2021-01-23 ENCOUNTER — Other Ambulatory Visit: Payer: Self-pay

## 2021-01-23 VITALS — BP 158/80 | HR 70 | Ht 65.0 in | Wt 163.0 lb

## 2021-01-23 DIAGNOSIS — I10 Essential (primary) hypertension: Secondary | ICD-10-CM

## 2021-01-23 DIAGNOSIS — R519 Headache, unspecified: Secondary | ICD-10-CM | POA: Diagnosis not present

## 2021-01-23 MED ORDER — VALSARTAN-HYDROCHLOROTHIAZIDE 160-12.5 MG PO TABS: 1.0000 | ORAL_TABLET | Freq: Every day | ORAL | 0 refills | Status: DC

## 2021-01-23 MED ORDER — HYDROCODONE-ACETAMINOPHEN 5-325 MG PO TABS: 1.0000 | ORAL_TABLET | Freq: Four times a day (QID) | ORAL | 0 refills | Status: DC | PRN

## 2021-01-23 NOTE — Progress Notes (Signed)
Subjective:  Garrett Henry is a 65 y.o. male who presents for Chief Complaint  Patient presents with  . Headache    Pt present for headache for 1 week     He notes headache x 1 week.  He denies history of frequent headaches.  Was in usual state of health 2 weeks ago.  Before the last week he has had a intractable headache.  He does get some heaviness in the shoulders and top of the head.  Not sure what triggered this.  Not worse with bright light a mild noise. No nausea, no dizziness, no ringing in ear.  Ear is itching some, for weeks. he does not drink coffee or a lot of caffeine. Typically eats 3 meals per day.  Wears glasses.  Last eye doctor visit 3- 4 years ago  Last visit we changed her lisinopril to valsartan due to cough.  He has had no more cough since last visit thankfully.  He checks blood pressure some.  BP this morning 159,   Headaches not worse with activity.  Denies snoring or witnessed apnea.  Is married.  Using tylenol No numbness, no tingling, no weakness.    No other aggravating or relieving factors.    No other c/o.  Past Medical History:  Diagnosis Date  . Acute bronchitis   . Adenomatous colon polyp   . Allergy   . Bronchitis   . Cough   . Diverticulosis   . GERD (gastroesophageal reflux disease)   . Hypertension   . Internal hemorrhoids   . Kidney stone   . PNA (pneumonia)    Current Outpatient Medications on File Prior to Visit  Medication Sig Dispense Refill  . famotidine (PEPCID) 20 MG tablet Take 1 tablet (20 mg total) by mouth every evening. 30 tablet 3  . valsartan (DIOVAN) 80 MG tablet Take 1 tablet (80 mg total) by mouth daily. 30 tablet 2  . [DISCONTINUED] omeprazole (PRILOSEC) 20 MG capsule Take 1 capsule (20 mg total) by mouth 2 (two) times daily before a meal. 60 capsule 0   No current facility-administered medications on file prior to visit.     The following portions of the patient's history were reviewed and updated as appropriate:  allergies, current medications, past family history, past medical history, past social history, past surgical history and problem list.  ROS Otherwise as in subjective above   Objective: BP (!) 158/80   Pulse 70   Ht 5\' 5"  (1.651 m)   Wt 163 lb (73.9 kg)   SpO2 95%   BMI 27.12 kg/m   BP Readings from Last 3 Encounters:  01/23/21 (!) 158/80  12/01/20 (!) 150/78  12/01/20 (!) 150/82   General appearance: alert, no distress, well developed, well nourished, Asian male HEENT: normocephalic, sclerae anicteric, conjunctiva pink and moist, TMs pearly, nares patent, no discharge or erythema, pharynx normal Oral cavity: MMM, no lesions ,missing some upper teeth, but teeth in relatively good repair Neck: supple, no lymphadenopathy, no thyromegaly, no masses, nontender, normal ROM, no bruits Heart: RRR, normal S1, S2, no murmurs Lungs: CTA bilaterally, no wheezes, rhonchi, or rales Pulses: 2+ radial pulses, 2+ pedal pulses, normal cap refill Ext: no edema Neuro: cn2-12 intact, nonfocal exam, no cerebellar signs Psych: pleasant , good eye contact, answer questions appropriately   Assessment: Encounter Diagnoses  Name Primary?  . Intractable headache, unspecified chronicity pattern, unspecified headache type Yes  . Essential hypertension, benign      Plan: We discussed potential causes  of headache.  No specific trigger known yet.  His blood pressure is not quite under control.  We discussed the vast differential for headaches.  We discussed possible other evaluation including head scan if the headaches do not resolve soon.  We discussed acute therapy today, change to different version of valsartan for better blood pressure control  I will plan to see him back in 6 to 8 weeks for physical fasting labs.  However if headache not much improved within the next 2 to 3 days he is instructed to call back or come back in  He declines labs today  Garrett Henry was seen today for  headache.  Diagnoses and all orders for this visit:  Intractable headache, unspecified chronicity pattern, unspecified headache type  Essential hypertension, benign  Other orders -     valsartan-hydrochlorothiazide (DIOVAN-HCT) 160-12.5 MG tablet; Take 1 tablet by mouth daily. -     HYDROcodone-acetaminophen (NORCO) 5-325 MG tablet; Take 1 tablet by mouth every 6 (six) hours as needed.    Follow up:6-8 weeks for fasting physical

## 2021-01-23 NOTE — Patient Instructions (Addendum)
Encounter Diagnoses  Name Primary?  . Intractable headache, unspecified chronicity pattern, unspecified headache type Yes  . Essential hypertension, benign     Recommendations:   There are many things that can cause headaches such as too much caffeine, certain smells or odors, being dehydrated, not getting adequate sleep, tension in the muscles in the upper back, poor posture, but also scary things like cancer, tumors and aneurysms.  We can not tell you 100% was causing the headache without a head scan  If your headache does not improve over the next 2 weeks then we may need to get a head scan  For now I will have you changed to a higher dose of blood pressure medicine called valsartan HCT 160/12.5 mg daily as you are blood pressure is not quite to goal  I hope that as your blood pressure improves the headaches will also resolve  For the next day or 2 you can use the short-term pain medicine called Norco or hydrocodone/Tylenol.  For mild headache you can do Tylenol over-the-counter.  For the headache you have now go ahead and take a hydrocodone tablet this evening and hopefully this will break the headache cycle.  Drink plenty of fluids throughout the day particular water  Limit salt and salty foods and processed foods  Try to get exercise and stretching daily  I do recommend you recheck soon for physical so we can do fasting labs.  It is generally cheaper to do fasting labs as part of a physical  Even if you do not come in for a physical in a month we need to do blood work in about 4 to 6 weeks after being on the higher dose of blood pressure medication  If you do not see significant improvements in your headache over the next 2 to 3 days then let me know     General Headache Without Cause A headache is pain or discomfort that is felt around the head or neck area. There are many causes and types of headaches. In some cases, the cause may not be found. Follow these instructions  at home: Watch your condition for any changes. Let your doctor know about them. Take these steps to help with your condition: Managing pain  Take over-the-counter and prescription medicines only as told by your doctor.  Lie down in a dark, quiet room when you have a headache.  If told, put ice on your head and neck area: ? Put ice in a plastic bag. ? Place a towel between your skin and the bag. ? Leave the ice on for 20 minutes, 2-3 times per day.  If told, put heat on the affected area. Use the heat source that your doctor recommends, such as a moist heat pack or a heating pad. ? Place a towel between your skin and the heat source. ? Leave the heat on for 20-30 minutes. ? Remove the heat if your skin turns bright red. This is very important if you are unable to feel pain, heat, or cold. You may have a greater risk of getting burned.  Keep lights dim if bright lights bother you or make your headaches worse.      Eating and drinking  Eat meals on a regular schedule.  If you drink alcohol: ? Limit how much you use to:  0-1 drink a day for women.  0-2 drinks a day for men. ? Be aware of how much alcohol is in your drink. In the [place] one drink  equals one 12 oz bottle of beer (355 mL), one 5 oz glass of wine (148 mL), or one 1 oz glass of hard liquor (44 mL).  Stop drinking caffeine, or reduce how much caffeine you drink. General instructions  Keep a journal to find out if certain things bring on headaches. For example, write down: ? What you eat and drink. ? How much sleep you get. ? Any change to your diet or medicines.  Get a massage or try other ways to relax.  Limit stress.  Sit up straight. Do not tighten (tense) your muscles.  Do not use any products that contain nicotine or tobacco. This includes cigarettes, e-cigarettes, and chewing tobacco. If you need help quitting, ask your doctor.  Exercise regularly as told by your doctor.  Get enough sleep. This often  means 7-9 hours of sleep each night.  Keep all follow-up visits as told by your doctor. This is important.   Contact a doctor if:  Your symptoms are not helped by medicine.  You have a headache that feels different than the other headaches.  You feel sick to your stomach (nauseous) or you throw up (vomit).  You have a fever. Get help right away if:  Your headache gets very bad quickly.  Your headache gets worse after a lot of physical activity.  You keep throwing up.  You have a stiff neck.  You have trouble seeing.  You have trouble speaking.  You have pain in the eye or ear.  Your muscles are weak or you lose muscle control.  You lose your balance or have trouble walking.  You feel like you will pass out (faint) or you pass out.  You are mixed up (confused).  You have a seizure. Summary  A headache is pain or discomfort that is felt around the head or neck area.  There are many causes and types of headaches. In some cases, the cause may not be found.  Keep a journal to help find out what causes your headaches. Watch your condition for any changes. Let your doctor know about them.  Contact a doctor if you have a headache that is different from usual, or if your headache is not helped by medicine.  Get help right away if your headache gets very bad, you throw up, you have trouble seeing, you lose your balance, or you have a seizure. This information is not intended to replace advice given to you by your health care provider. Make sure you discuss any questions you have with your health care provider. Document Revised: 04/28/2018 Document Reviewed: 04/28/2018 Elsevier Patient Education  [place].

## 2021-02-26 ENCOUNTER — Other Ambulatory Visit: Payer: Self-pay | Admitting: Medical

## 2021-03-22 ENCOUNTER — Other Ambulatory Visit: Payer: Self-pay | Admitting: Internal Medicine

## 2021-03-22 ENCOUNTER — Other Ambulatory Visit: Payer: Self-pay | Admitting: Medical

## 2021-04-20 ENCOUNTER — Other Ambulatory Visit: Payer: Self-pay | Admitting: Medical

## 2021-04-20 NOTE — Telephone Encounter (Signed)
Has an appt in august

## 2021-06-12 ENCOUNTER — Encounter: Payer: 59 | Admitting: Medical

## 2021-06-19 ENCOUNTER — Ambulatory Visit (INDEPENDENT_AMBULATORY_CARE_PROVIDER_SITE_OTHER): Payer: 59 | Admitting: Medical

## 2021-06-19 ENCOUNTER — Encounter: Payer: Self-pay | Admitting: Medical

## 2021-06-19 ENCOUNTER — Other Ambulatory Visit: Payer: Self-pay

## 2021-06-19 VITALS — BP 130/70 | HR 65 | Ht 65.0 in | Wt 162.6 lb

## 2021-06-19 DIAGNOSIS — I10 Essential (primary) hypertension: Secondary | ICD-10-CM

## 2021-06-19 DIAGNOSIS — Z0000 Encounter for general adult medical examination without abnormal findings: Secondary | ICD-10-CM | POA: Diagnosis not present

## 2021-06-19 DIAGNOSIS — Z13220 Encounter for screening for lipoid disorders: Secondary | ICD-10-CM

## 2021-06-19 DIAGNOSIS — Z23 Encounter for immunization: Secondary | ICD-10-CM

## 2021-06-19 DIAGNOSIS — K219 Gastro-esophageal reflux disease without esophagitis: Secondary | ICD-10-CM

## 2021-06-19 DIAGNOSIS — Z1329 Encounter for screening for other suspected endocrine disorder: Secondary | ICD-10-CM

## 2021-06-19 DIAGNOSIS — Z87891 Personal history of nicotine dependence: Secondary | ICD-10-CM | POA: Insufficient documentation

## 2021-06-19 DIAGNOSIS — I700 Atherosclerosis of aorta: Secondary | ICD-10-CM | POA: Diagnosis not present

## 2021-06-19 DIAGNOSIS — Z125 Encounter for screening for malignant neoplasm of prostate: Secondary | ICD-10-CM

## 2021-06-19 LAB — URINALYSIS
Bilirubin, UA: NEGATIVE
Glucose, UA: NEGATIVE
Ketones, UA: NEGATIVE
Leukocytes,UA: NEGATIVE
Nitrite, UA: NEGATIVE
Protein,UA: NEGATIVE
RBC, UA: NEGATIVE
Specific Gravity, UA: 1.026 (ref 1.005–1.030)
Urobilinogen, Ur: 0.2 mg/dL (ref 0.2–1.0)
pH, UA: 5.5 (ref 5.0–7.5)

## 2021-06-19 NOTE — Progress Notes (Signed)
Subjective:   HPI  Garrett Henry is a 65 y.o. male who presents for Chief Complaint  Patient presents with   FASTING CPE    Fasting cpe, no concerns    Patient Care Team: Marce Schartz, Camelia Eng, PA-C as PCP - General (Family Medicine) Sees dentist Sees eye doctor  Concerns: None Compliant with BP medication, home BPs normal  Declines other cancer screens and extra screening and prevention measures.  Reviewed their medical, surgical, family, social, medication, and allergy history and updated chart as appropriate.  Past Medical History:  Diagnosis Date   Acute bronchitis    Adenomatous colon polyp    Allergy    Bronchitis    Cough    Diverticulosis    GERD (gastroesophageal reflux disease)    Hypertension    Internal hemorrhoids    Kidney stone    PNA (pneumonia)    Wears glasses     Past Surgical History:  Procedure Laterality Date   LUNG REMOVAL, PARTIAL Left 1995    Family History  Problem Relation Age of Onset   Stroke Father    Colon cancer Neg Hx    Rectal cancer Neg Hx    Stomach cancer Neg Hx      Current Outpatient Medications:    famotidine (PEPCID) 20 MG tablet, TAKE 1 TABLET BY MOUTH EVERY DAY IN THE EVENING, Disp: 90 tablet, Rfl: 1   valsartan-hydrochlorothiazide (DIOVAN-HCT) 160-12.5 MG tablet, TAKE 1 TABLET BY MOUTH EVERY DAY, Disp: 90 tablet, Rfl: 0  Allergies  Allergen Reactions   Amoxicillin     Pressure in face   Lisinopril     cough     Review of Systems Constitutional: -fever, -chills, -sweats, -unexpected weight change, -decreased appetite, -fatigue Allergy: -sneezing, -itching, -congestion Dermatology: -changing moles, --rash, -lumps ENT: -runny nose, -ear pain, -sore throat, -hoarseness, -sinus pain, -teeth pain, - ringing in ears, -hearing loss, -nosebleeds Cardiology: -chest pain, -palpitations, -swelling, -difficulty breathing when lying flat, -waking up short of breath Respiratory: -cough, -shortness of breath, -difficulty  breathing with exercise or exertion, -wheezing, -coughing up blood Gastroenterology: -abdominal pain, -nausea, -vomiting, -diarrhea, -constipation, -blood in stool, -changes in bowel movement, -difficulty swallowing or eating Hematology: -bleeding, -bruising  Musculoskeletal: -joint aches, -muscle aches, -joint swelling, -back pain, -neck pain, -cramping, -changes in gait Ophthalmology: denies vision changes, eye redness, itching, discharge Urology: -burning with urination, -difficulty urinating, -blood in urine, -urinary frequency, -urgency, -incontinence Neurology: -headache, -weakness, -tingling, -numbness, -memory loss, -falls, -dizziness Psychology: -depressed mood, -agitation, -sleep problems Male GU: no testicular mass, pain, no lymph nodes swollen, no swelling, no rash.  Depression screen [facility] 2/9 06/19/2021 01/23/2021  Decreased Interest 0 0  Down, Depressed, Hopeless 0 0  PHQ - 2 Score 0 0        Objective:  BP 130/70   Pulse 65   Ht '5\' 5"'$  (1.651 m)   Wt 162 lb 9.6 oz (73.8 kg)   SpO2 98%   BMI 27.06 kg/m   General appearance: alert, no distress, WD/WN, [place] male Skin: faded tattoo left forearm, no worrisome lesions HEENT: normocephalic, conjunctiva/corneas normal, sclerae anicteric, PERRLA, EOMi, nares patent, no discharge or erythema, pharynx normal Oral cavity: MMM, tongue normal, teeth normal Neck: supple, no lymphadenopathy, no thyromegaly, no masses, normal ROM, no bruits Chest: non tender, normal shape and expansion Heart: RRR, normal S1, S2, no murmurs Lungs: CTA bilaterally, no wheezes, rhonchi, or rales Abdomen: +bs, soft, non tender, non distended, no masses, no hepatomegaly, no splenomegaly, no bruits Back:  non tender, normal ROM, no scoliosis Musculoskeletal: upper extremities non tender, no obvious deformity, normal ROM throughout, lower extremities non tender, no obvious deformity, normal ROM throughout Extremities: no edema, no cyanosis, no  clubbing Pulses: 2+ symmetric, upper and lower extremities, normal cap refill Neurological: alert, oriented x 3, CN2-12 intact, strength normal upper extremities and lower extremities, sensation normal throughout, DTRs 2+ throughout, no cerebellar signs, gait normal Psychiatric: normal affect, behavior normal, pleasant  GU/rectal - declines   Assessment and Plan :   Encounter Diagnoses  Name Primary?   Encounter for health maintenance examination in adult Yes   Need for influenza vaccination    Aortic atherosclerosis ([place])    Essential hypertension, benign    Gastroesophageal reflux disease, unspecified whether esophagitis present    Screening for lipid disorders    Screening for thyroid disorder    Screening for prostate cancer    Former smoker     This visit was a preventative care visit, also known as wellness visit or routine physical.   Topics typically include healthy lifestyle, diet, exercise, preventative care, vaccinations, sick and well care, proper use of emergency dept and after hours care, as well as other concerns.     Recommendations: Continue to return yearly for your annual wellness and preventative care visits.  This gives [place] a chance to discuss healthy lifestyle, exercise, vaccinations, review your chart record, and perform screenings where appropriate.  I recommend you see your eye doctor yearly for routine vision care.  I recommend you see your dentist yearly for routine dental care including hygiene visits twice yearly.   Vaccination recommendations were reviewed Immunization History  Administered Date(s) Administered   Influenza-Unspecified 09/21/2016   PFIZER(Purple Top)SARS-COV-2 Vaccination 02/04/2020, 02/29/2020   You declined Pneumococcal, tetanus and shingles vaccines today  Counseled on the influenza virus vaccine.  Vaccine information sheet given.  Influenza vaccine given after consent obtained.   Screening for cancer: Colon cancer  screening: I reviewed your colonoscopy on file that is up to date from 2019  Your gastro doctor recommended repeat colonoscopy in 2024.  I recommend PSA, prostate exam, and prostate cancer screening risks/benefits.     Skin cancer screening: Check your skin regularly for new changes, growing lesions, or other lesions of concern Come in for evaluation if you have skin lesions of concern.  Lung cancer screening: If you have a greater than 20 pack year history of tobacco use, then you may qualify for lung cancer screening with a chest CT scan.   Please call your insurance company to inquire about coverage for this test.  We currently don't have screenings for other cancers besides breast, cervical, colon, and lung cancers.  If you have a strong family history of cancer or have other cancer screening concerns, please let me know.    Bone health: Get at least 150 minutes of aerobic exercise weekly Get weight bearing exercise at least once weekly Bone density test:  A bone density test is an imaging test that uses a type of X-ray to measure the amount of calcium and other minerals in your bones. The test may be used to diagnose or screen you for a condition that causes weak or thin bones (osteoporosis), predict your risk for a broken bone (fracture), or determine how well your osteoporosis treatment is working. The bone density test is recommended for females 41 and older, or females or males [id number] if certain risk factors such as thyroid disease, long term use of steroids such as  for asthma or rheumatological issues, vitamin D deficiency, estrogen deficiency, family history of osteoporosis, self or family history of fragility fracture in first degree relative.    Heart health: Get at least 150 minutes of aerobic exercise weekly Limit alcohol It is important to maintain a healthy blood pressure and healthy cholesterol numbers  Heart disease screening: Screening for heart disease includes  screening for blood pressure, fasting lipids, glucose/diabetes screening, BMI height to weight ratio, reviewed of smoking status, physical activity, and diet.    Goals include blood pressure 120/80 or less, maintaining a healthy lipid/cholesterol profile, preventing diabetes or keeping diabetes numbers under good control, not smoking or using tobacco products, exercising most days per week or at least 150 minutes per week of exercise, and eating healthy variety of fruits and vegetables, healthy oils, and avoiding unhealthy food choices like fried food, fast food, high sugar and high cholesterol foods.    Other tests may possibly include EKG test, CT coronary calcium score, echocardiogram, exercise treadmill stress test.   You decline EKG screen today   Medical care options: I recommend you continue to seek care here first for routine care.  We try really hard to have available appointments Monday through Friday daytime hours for sick visits, acute visits, and physicals.  Urgent care should be used for after hours and weekends for significant issues that cannot wait till the next day.  The emergency department should be used for significant potentially life-threatening emergencies.  The emergency department is expensive, can often have long wait times for less significant concerns, so try to utilize primary care, urgent care, or telemedicine when possible to avoid unnecessary trips to the emergency department.  Virtual visits and telemedicine have been introduced since the pandemic started in 2020, and can be convenient ways to receive medical care.  We offer virtual appointments as well to assist you in a variety of options to seek medical care.   Advanced Directives: I recommend you consider completing a [place] and Living Will.   These documents respect your wishes and help alleviate burdens on your loved ones if you were to become terminally ill or be in a position to need  those documents enforced.    You can complete Advanced Directives yourself, have them notarized, then have copies made for our office, for you and for anybody you feel should have them in safe keeping.  Or, you can have an attorney prepare these documents.   If you haven't updated your Last Will and Testament in a while, it may be worthwhile having an attorney prepare these documents together and save on some costs.       Separate significant issues discussed: High blood pressure-continue current medication  Aortic atherosclerosis-he had a CT scan in 2020 that in addition to kidney stones showed cholesterol buildup in your aorta.  This suggest that you may have cholesterol buildup or blockages in other vessels as well.  This is considered vascular disease.  I recommend you start Crestor rosuvastatin medication at bedtime daily to help prevent future blockages to reduce risk of heart disease and stroke.  Consider this and let me know if agreeable to this medication  Former smoker-I recommend periodic screening with chest x-ray or other imaging of the chest given prior 20-pack-year history.    Sabir was seen today for fasting cpe.  Diagnoses and all orders for this visit:  Encounter for health maintenance examination in adult -     Comprehensive metabolic panel -  CBC with Differential/Platelet -     Lipid panel -     TSH -     PSA -     Urinalysis  Need for influenza vaccination  Aortic atherosclerosis (HCC)  Essential hypertension, benign  Gastroesophageal reflux disease, unspecified whether esophagitis present  Screening for lipid disorders -     Lipid panel  Screening for thyroid disorder -     TSH  Screening for prostate cancer -     PSA  Former smoker  Follow-up pending labs, yearly for physical

## 2021-06-19 NOTE — Patient Instructions (Signed)
This visit was a preventative care visit, also known as wellness visit or routine physical.   Topics typically include healthy lifestyle, diet, exercise, preventative care, vaccinations, sick and well care, proper use of emergency dept and after hours care, as well as other concerns.     Recommendations: Continue to return yearly for your annual wellness and preventative care visits.  This gives [place] a chance to discuss healthy lifestyle, exercise, vaccinations, review your chart record, and perform screenings where appropriate.  I recommend you see your eye doctor yearly for routine vision care.  I recommend you see your dentist yearly for routine dental care including hygiene visits twice yearly.   Vaccination recommendations were reviewed Immunization History  Administered Date(s) Administered   Influenza-Unspecified 09/21/2016   PFIZER(Purple Top)SARS-COV-2 Vaccination 02/04/2020, 02/29/2020   You declined Pneumococcal, tetanus and shingles vaccines today  Counseled on the influenza virus vaccine.  Vaccine information sheet given.  Influenza vaccine given after consent obtained.   Screening for cancer: Colon cancer screening: I reviewed your colonoscopy on file that is up to date from 2019  Your gastro doctor recommended repeat colonoscopy in 2024.  I recommend PSA, prostate exam, and prostate cancer screening risks/benefits.     Skin cancer screening: Check your skin regularly for new changes, growing lesions, or other lesions of concern Come in for evaluation if you have skin lesions of concern.  Lung cancer screening: If you have a greater than 20 pack year history of tobacco use, then you may qualify for lung cancer screening with a chest CT scan.   Please call your insurance company to inquire about coverage for this test.  We currently don't have screenings for other cancers besides breast, cervical, colon, and lung cancers.  If you have a strong family history of cancer  or have other cancer screening concerns, please let me know.    Bone health: Get at least 150 minutes of aerobic exercise weekly Get weight bearing exercise at least once weekly Bone density test:  A bone density test is an imaging test that uses a type of X-ray to measure the amount of calcium and other minerals in your bones. The test may be used to diagnose or screen you for a condition that causes weak or thin bones (osteoporosis), predict your risk for a broken bone (fracture), or determine how well your osteoporosis treatment is working. The bone density test is recommended for females 32 and older, or females or males [id number] if certain risk factors such as thyroid disease, long term use of steroids such as for asthma or rheumatological issues, vitamin D deficiency, estrogen deficiency, family history of osteoporosis, self or family history of fragility fracture in first degree relative.    Heart health: Get at least 150 minutes of aerobic exercise weekly Limit alcohol It is important to maintain a healthy blood pressure and healthy cholesterol numbers  Heart disease screening: Screening for heart disease includes screening for blood pressure, fasting lipids, glucose/diabetes screening, BMI height to weight ratio, reviewed of smoking status, physical activity, and diet.    Goals include blood pressure 120/80 or less, maintaining a healthy lipid/cholesterol profile, preventing diabetes or keeping diabetes numbers under good control, not smoking or using tobacco products, exercising most days per week or at least 150 minutes per week of exercise, and eating healthy variety of fruits and vegetables, healthy oils, and avoiding unhealthy food choices like fried food, fast food, high sugar and high cholesterol foods.    Other tests may possibly  include EKG test, CT coronary calcium score, echocardiogram, exercise treadmill stress test.   You decline EKG screen today   Medical care  options: I recommend you continue to seek care here first for routine care.  We try really hard to have available appointments Monday through Friday daytime hours for sick visits, acute visits, and physicals.  Urgent care should be used for after hours and weekends for significant issues that cannot wait till the next day.  The emergency department should be used for significant potentially life-threatening emergencies.  The emergency department is expensive, can often have long wait times for less significant concerns, so try to utilize primary care, urgent care, or telemedicine when possible to avoid unnecessary trips to the emergency department.  Virtual visits and telemedicine have been introduced since the pandemic started in 2020, and can be convenient ways to receive medical care.  We offer virtual appointments as well to assist you in a variety of options to seek medical care.   Advanced Directives: I recommend you consider completing a [place] and Living Will.   These documents respect your wishes and help alleviate burdens on your loved ones if you were to become terminally ill or be in a position to need those documents enforced.    You can complete Advanced Directives yourself, have them notarized, then have copies made for our office, for you and for anybody you feel should have them in safe keeping.  Or, you can have an attorney prepare these documents.   If you haven't updated your Last Will and Testament in a while, it may be worthwhile having an attorney prepare these documents together and save on some costs.       Separate significant issues discussed: High blood pressure-continue current medication  Aortic atherosclerosis-he had a CT scan in 2020 that in addition to kidney stones showed cholesterol buildup in your aorta.  This suggest that you may have cholesterol buildup or blockages in other vessels as well.  This is considered vascular disease.  I recommend  you start Crestor rosuvastatin medication at bedtime daily to help prevent future blockages to reduce risk of heart disease and stroke.  Consider this and let me know if agreeable to this medication  Former smoker-I recommend periodic screening with chest x-ray or other imaging of the chest given prior 20-pack-year history.     Atherosclerosis  Atherosclerosis is narrowing and hardening of the arteries. Arteries are blood vessels that carry blood from the heart to all parts of the body. This blood contains oxygen. Arteries can become narrow or blocked from inflammation or from a buildup of fat, cholesterol, calcium, and other substances (plaque). Plaque decreases the amount of blood that can flow through the artery. Atherosclerosis can affect any artery in your body, including: Heart arteries. Damage to these arteries may lead to coronary artery disease, which can cause a heart attack. Brain arteries. Damage to these arteries may cause a stroke. Leg, arm, and pelvis arteries. Peripheral artery disease (PAD) may result from damage to these arteries. Kidney arteries. Kidney (renal) failure may result from damage to kidney arteries. Treatment may slow the disease and prevent further damage to your heart, brain,peripheral arteries, and kidneys. What are the causes? This condition develops slowly over many years. The inner layers of your arteries become damaged and allow the gradual buildup of plaque. The exact cause of atherosclerosis is not fully understood. Symptoms of atherosclerosisdo not occur until an artery becomes narrow or blocked. What  increases the risk? The following factors may make you more likely to develop this condition: Being middle-aged or older. Certain medical conditions, including: High blood pressure. High cholesterol. High blood fats (triglycerides). Diabetes. Sleep apnea. A family history of atherosclerosis. Being overweight. Using products that contain tobacco or  nicotine. Not exercising enough (sedentary lifestyle). Having a substance in your blood called C-reactive protein (CRP). This is a sign of increased levels of inflammation in your body. Being stressed. Drinking too much alcohol or using drugs, such as cocaine or methamphetamine. What are the signs or symptoms? Symptoms of atherosclerosis may not be obvious until there is damage to an area of your body that is not getting enough blood. Sometimes, atherosclerosis doesnot cause symptoms. Symptoms of this condition include: Coronary artery disease. This may cause chest pain and shortness of breath. Decreased blood supply to your brain, which may cause a stroke. Signs of a stroke may include sudden: Weakness or numbness in your face, arm, or leg, especially on one side of your body. Trouble walking or difficulty moving your arms or legs. Loss of balance or coordination. Confusion. Slurred speech (dysarthria). Trouble speaking, or trouble understanding speech, or both (aphasia). Vision changes in one or both eyes. This may be double vision, blurred vision, or loss of vision. Severe headache with no known cause. The headache is often described as the worst headache ever experienced. PAD, which may cause pain and numbness, often in your legs and hips. Renal failure. This may cause tiredness, problems with urination, swelling, and itchy skin. How is this diagnosed? This condition is diagnosed based on your medical history and a physical exam. During the exam, your health care provider will: Check your pulse in different places. Listen for a "whooshing" sound over your arteries (bruit). You may also have tests, such as: Blood tests to check your levels of cholesterol, triglycerides, and CRP. Electrocardiogram (ECG) to check for heart damage. Chest X-ray to see if you have an enlarged heart, which is a sign of heart failure. Stress test to see how your heart reacts to exercise. Echocardiogram to  get images of the inside of your heart. Ankle-brachial index to compare blood pressure in your arms to blood pressure in your ankles. Ultrasound of your peripheral arteries to check blood flow. CT scan to check for damage to your heart or brain. X-rays of blood vessels after dye has been injected (angiogram) to check blood flow. How is this treated? This condition is treated with lifestyle changes as the first step. These may include: Changing your diet. Losing weight. Reducing stress. Exercising and being physically active more regularly. Quitting smoking. You may also need medicine to: Lower triglycerides and cholesterol. Control blood pressure. Prevent blood clots. Lower inflammation in your body. Control your blood sugar. Sometimes, surgery is needed to: Remove plaque from an artery (endarterectomy). Open or widen a narrowed heart artery (angioplasty). Create a new path for your blood with one of these procedures: Heart (coronary) artery bypass graft surgery. Peripheral artery bypass graft surgery. Follow these instructions at home: Eating and drinking  Eat a heart-healthy diet. Talk with your health care provider or a diet and nutrition specialist (dietitian) if you need help. A heart-healthy diet involves: Limiting unhealthy fats and increasing healthy fats. Some examples of healthy fats are olive oil and canola oil. Eating plant-based foods, such as fruits, vegetables, nuts, whole grains, and legumes (such as peas and lentils). If you drink alcohol: Limit how much you use to: 0-1 drink a day  for nonpregnant women. 0-2 drinks a day for men. Be aware of how much alcohol is in your drink. In the [place] one drink equals one 12 oz bottle of beer (355 mL), one 5 oz glass of wine (148 mL), or one 1 oz glass of hard liquor (44 mL).  Lifestyle  Maintain a healthy weight. Lose weight if your health care provider says that you need to do that. Follow an exercise program as told by  your health care provider. Do not use any products that contain nicotine or tobacco, such as cigarettes, e-cigarettes, and chewing tobacco. If you need help quitting, ask your health care provider. Do not use drugs.  General instructions Take over-the-counter and prescription medicines only as told by your health care provider. Manage other health conditions as told. Keep all follow-up visits as told by your health care provider. This is important. Contact a health care provider if you have: An irregular heartbeat. Unexplained fatigue. Trouble urinating, or you are producing less urine or foamy urine. Swelling of your hands or feet, or itchy skin. Unexplained pain or numbness in your legs or hips. Get help right away if: You have any symptoms of a heart attack. These may be: Chest pain. This includes squeezing chest pain that may feel like indigestion (angina). Shortness of breath. Pain in your neck, jaw, arms, back, or stomach. Cold sweat. Nausea. Light-headedness. You have any symptoms of a stroke. "BE FAST" is an easy way to remember the main warning signs of a stroke: B - Balance. Signs are dizziness, sudden trouble walking, or loss of balance. E - Eyes. Signs are trouble seeing or a sudden change in vision. F - Face. Signs are sudden weakness or numbness of the face, or the face or eyelid drooping on one side. A - Arms. Signs are weakness or numbness in an arm. This happens suddenly and usually on one side of the body. S - Speech. Signs are sudden trouble speaking, slurred speech, or trouble understanding what people say. T - Time. Time to call emergency services. Write down what time symptoms started. You have other signs of a stroke, such as: A sudden, severe headache with no known cause. Nausea or vomiting. Seizure. These symptoms may represent a serious problem that is an emergency. Do not wait to see if the symptoms will go away. Get medical help right away. Call your local  emergency services (911 in the [place] Do not drive yourself to the hospital. Summary Atherosclerosis is narrowing and hardening of the arteries. Arteries can become narrow from inflammation or from a buildup of fat, cholesterol, calcium, and other substances (plaque). This condition may not cause any symptoms. If you do have symptoms, they are caused by damage to an area of your body that is not getting enough blood. Treatment starts with lifestyle changes and may include medicines. In some cases, surgery is needed. Get help right away if you have any symptoms of a heart attack or stroke. This information is not intended to replace advice given to you by your health care provider. Make sure you discuss any questions you have with your healthcare provider. Document Revised: 08/17/2019 Document Reviewed: 08/17/2019 Elsevier Patient Education  [place].

## 2021-06-19 NOTE — Addendum Note (Signed)
Addended by: Minette Headland A on: 06/19/2021 10:27 AM   Modules accepted: Orders

## 2021-06-20 ENCOUNTER — Other Ambulatory Visit: Payer: Self-pay | Admitting: Medical

## 2021-06-20 LAB — COMPREHENSIVE METABOLIC PANEL
ALT: 13 IU/L (ref 0–44)
AST: 24 IU/L (ref 0–40)
Albumin/Globulin Ratio: 1.8 (ref 1.2–2.2)
Albumin: 4.7 g/dL (ref 3.8–4.8)
Alkaline Phosphatase: 72 IU/L (ref 44–121)
BUN/Creatinine Ratio: 27 — ABNORMAL HIGH (ref 10–24)
BUN: 26 mg/dL (ref 8–27)
Bilirubin Total: 0.5 mg/dL (ref 0.0–1.2)
CO2: 25 mmol/L (ref 20–29)
Calcium: 9.5 mg/dL (ref 8.6–10.2)
Chloride: 100 mmol/L (ref 96–106)
Creatinine, Ser: 0.95 mg/dL (ref 0.76–1.27)
Globulin, Total: 2.6 g/dL (ref 1.5–4.5)
Glucose: 91 mg/dL (ref 65–99)
Potassium: 4.6 mmol/L (ref 3.5–5.2)
Sodium: 140 mmol/L (ref 134–144)
Total Protein: 7.3 g/dL (ref 6.0–8.5)
eGFR: 89 mL/min/{1.73_m2} (ref >59)

## 2021-06-20 LAB — CBC WITH DIFFERENTIAL/PLATELET
Basophils Absolute: 0 10*3/uL (ref 0.0–0.2)
Basos: 1 %
EOS (ABSOLUTE): 0.1 10*3/uL (ref 0.0–0.4)
Eos: 1 %
Hematocrit: 44.1 % (ref 37.5–51.0)
Hemoglobin: 13.7 g/dL (ref 13.0–17.7)
Immature Grans (Abs): 0 10*3/uL (ref 0.0–0.1)
Immature Granulocytes: 0 %
Lymphocytes Absolute: 2.4 10*3/uL (ref 0.7–3.1)
Lymphs: 31 %
MCH: 23.8 pg — ABNORMAL LOW (ref 26.6–33.0)
MCHC: 31.1 g/dL — ABNORMAL LOW (ref 31.5–35.7)
MCV: 77 fL — ABNORMAL LOW (ref 79–97)
Monocytes Absolute: 0.7 10*3/uL (ref 0.1–0.9)
Monocytes: 8 %
Neutrophils Absolute: 4.6 10*3/uL (ref 1.4–7.0)
Neutrophils: 59 %
Platelets: 174 10*3/uL (ref 150–450)
RBC: 5.76 x10E6/uL (ref 4.14–5.80)
RDW: 13.2 % (ref 11.6–15.4)
WBC: 7.8 10*3/uL (ref 3.4–10.8)

## 2021-06-20 LAB — PSA: Prostate Specific Ag, Serum: 1.5 ng/mL (ref 0.0–4.0)

## 2021-06-20 LAB — TSH: TSH: 1.74 u[IU]/mL (ref 0.450–4.500)

## 2021-06-20 LAB — LIPID PANEL
Chol/HDL Ratio: 4 ratio (ref 0.0–5.0)
Cholesterol, Total: 183 mg/dL (ref 100–199)
HDL: 46 mg/dL (ref >39)
LDL Chol Calc (NIH): 115 mg/dL — ABNORMAL HIGH (ref 0–99)
Triglycerides: 124 mg/dL (ref 0–149)
VLDL Cholesterol Cal: 22 mg/dL (ref 5–40)

## 2021-06-20 MED ORDER — ROSUVASTATIN CALCIUM 10 MG PO TABS: 10.0000 mg | ORAL_TABLET | Freq: Every day | ORAL | 3 refills | Status: DC

## 2021-06-20 MED ORDER — VALSARTAN-HYDROCHLOROTHIAZIDE 160-12.5 MG PO TABS
1.0000 | ORAL_TABLET | Freq: Every day | ORAL | 3 refills | Status: DC
Start: 2021-06-20 — End: 2022-08-20

## 2021-09-18 ENCOUNTER — Other Ambulatory Visit: Payer: Self-pay | Admitting: Internal Medicine

## 2021-12-25 ENCOUNTER — Ambulatory Visit: Payer: 59 | Admitting: Medical

## 2021-12-25 ENCOUNTER — Other Ambulatory Visit: Payer: Self-pay

## 2021-12-25 VITALS — BP 130/80 | HR 63 | Temp 97.3°F | Wt 166.4 lb

## 2021-12-25 DIAGNOSIS — R200 Anesthesia of skin: Secondary | ICD-10-CM

## 2021-12-25 DIAGNOSIS — M25512 Pain in left shoulder: Secondary | ICD-10-CM

## 2021-12-25 DIAGNOSIS — R202 Paresthesia of skin: Secondary | ICD-10-CM

## 2021-12-25 DIAGNOSIS — M542 Cervicalgia: Secondary | ICD-10-CM | POA: Diagnosis not present

## 2021-12-25 DIAGNOSIS — M79642 Pain in left hand: Secondary | ICD-10-CM

## 2021-12-25 DIAGNOSIS — M2550 Pain in unspecified joint: Secondary | ICD-10-CM

## 2021-12-25 MED ORDER — GABAPENTIN 100 MG PO CAPS: 100.0000 mg | ORAL_CAPSULE | Freq: Two times a day (BID) | ORAL | 1 refills | Status: DC

## 2021-12-25 NOTE — Progress Notes (Signed)
Subjective: ? Garrett Henry is a 66 y.o. male who presents for ?Chief Complaint  ?Patient presents with  ? Numbness  ?  Numbness in fingers,  sometimes the tips are number. Has will pain in joints neck and head. Since 2nd week of February .   ?   ?Here for complaint of pain and numbness.  For a few weeks now has had some numbness worse in the left hand but gets some numbness of the fingertips bilaterally and even the feet bilaterally.  Worse in the left hand and fingers in general.  No recent injury trauma or fall. ? ?He does note having similar pains years ago even in his late teens and early 33s.  He had acupuncture at 1 point to help the symptoms in the remote past but felt like that made things worse ? ?He denies any swelling.  He gets pains in left shoulder, left elbow and left hand, left fingers. ? ?He is retired.  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 ?He does get neck pain posteriorly and upper left back pain. ? ?He denies alcohol use.  No Caffeine.  No history of vitamin deficiency.  Non-smoker. ? ?No other aggravating or relieving factors.   ? ?No other c/o. ? ?Past Medical History:  ?Diagnosis Date  ? Acute bronchitis   ? Adenomatous colon polyp   ? Allergy   ? Bronchitis   ? Cough   ? Diverticulosis   ? GERD (gastroesophageal reflux disease)   ? Hypertension   ? Internal hemorrhoids   ? Kidney stone   ? PNA (pneumonia)   ? Wears glasses   ? ?Current Outpatient Medications on File Prior to Visit  ?Medication Sig Dispense Refill  ? rosuvastatin (CRESTOR) 10 MG tablet Take 1 tablet (10 mg total) by mouth daily. 90 tablet 3  ? valsartan-hydrochlorothiazide (DIOVAN-HCT) 160-12.5 MG tablet Take 1 tablet by mouth daily. 90 tablet 3  ? [DISCONTINUED] omeprazole (PRILOSEC) 20 MG capsule Take 1 capsule (20 mg total) by mouth 2 (two) times daily before a meal. 60 capsule 0  ? ?No current facility-administered medications on file prior to visit.  ? ? ? ?The following portions of  the patient's history were reviewed and updated as appropriate: allergies, current medications, past family history, past medical history, past social history, past surgical history and problem list. ? ?ROS ?Otherwise as in subjective above ? ?Objective: ?BP 130/80   Pulse 63   Temp (!) 97.3 ?F (36.3 ?C)   Wt 166 lb 6.4 oz (75.5 kg)   BMI 27.69 kg/m?  ? ?General appearance: alert, no distress, well developed, well nourished ?Neck: supple, nontender normal range of motion, no lymphadenopathy, no thyromegaly, no masses, no bruits ?Tender left upper back paraspinal region otherwise back nontender without deformity ?MSK: Tender over left AC joint, tender over left biceps origin but otherwise arm nontender.  There is some mild arthritic changes at the MCPs of both hands and slight arthritic changes of some of the DIPs but no major deformity or swelling.  Range of motion of shoulders elbows wrist and fingers are normal.  No obvious swelling or deformity. ?Legs nontender without obvious deformity. ?Arms and legs neurovascularly intact ?Pulses: 2+ radial pulses, 2+ pedal pulses, normal cap refill ?Ext: no edema ? ? ?Assessment: ?Encounter Diagnoses  ?Name Primary?  ? Numbness and tingling Yes  ? Neck pain   ? Arthralgia, unspecified joint   ? Left shoulder pain,  unspecified chronicity   ? Left hand pain   ? ? ? ?Plan: ? ?Your symptoms could be related to neck arthritis or bulging disc, and possibly carpal tunnel syndrome. ? ?Recommendations: ?Begin Gabapentin '100mg'$  daily at bedtime .  This is a neuropathic pain medication that can help reduce numbness and pain.   This medication can cause drowsiness.    ?After a few weeks if you don't feel improvement, you can increase to twice daily Gabapentin. ?We will use the Gabapentin daily for now. ?You can also use Aleve over the counter occasionally for worse pains in neck or hands. ?Another option is topical cream such as capsaicin cream to muscles and hands and other joint  pains ?Hydrate well with water daily ?Stretch daily, and exercise daily such as walking or other regular exercise ?Consider massage therapy  ?Consider chiropractor therapy ?Consider using carpal tunnel wrist splints at bedtime for the time being to help with pain and numbness. ? ? ? ?The next steps in the evaluation would be: ?Xrays of neck ?Xray of left shoulder ?Xray of left hand ?Labs - sed rate marker for inflammation, vitamin B12 level ? ? ?You declined further eval for now, so lets begin trial of recommendations above. ? ? ?Garrett Henry was seen today for numbness. ? ?Diagnoses and all orders for this visit: ? ?Numbness and tingling ?-     DG Cervical Spine Complete; Future ? ?Neck pain ?-     DG Cervical Spine Complete; Future ? ?Arthralgia, unspecified joint ?-     DG Cervical Spine Complete; Future ?-     DG Hand Complete Left; Future ?-     DG Shoulder Left; Future ? ?Left shoulder pain, unspecified chronicity ?-     DG Shoulder Left; Future ? ?Left hand pain ?-     DG Hand Complete Left; Future ? ?Other orders ?-     gabapentin (NEURONTIN) 100 MG capsule; Take 1 capsule (100 mg total) by mouth 2 (two) times daily. ? ? ? ?Follow up: pending xrays or 52mof/u ? ? ?

## 2021-12-25 NOTE — Patient Instructions (Addendum)
Encounter Diagnoses  ?Name Primary?  ? Numbness and tingling Yes  ? Neck pain   ? Arthralgia, unspecified joint   ? ? ?Your symptoms could be related to neck arthritis or bulging disc, and possibly carpal tunnel syndrome. ? ?Recommendations: ?Begin Gabapentin '100mg'$  daily at bedtime .  This is a neuropathic pain medication that can help reduce numbness and pain.   This medication can cause drowsiness.    ?After a few weeks if you don't feel improvement, you can increase to twice daily Gabapentin. ?We will use the Gabapentin daily for now. ?You can also use Aleve over the counter occasionally for worse pains in neck or hands. ?Another option is topical cream such as capsaicin cream to muscles and hands and other joint pains ?Hydrate well with water daily ?Stretch daily, and exercise daily such as walking or other regular exercise ?Consider massage therapy  ?Consider chiropractor therapy ?Consider using carpal tunnel wrist splints at bedtime for the time being to help with pain and numbness. ? ? ? ? ?The next steps in the evaluation would be: ?Xrays of neck ?Xray of left shoulder ?Xray of left hand ?Labs - sed rate marker for inflammation, vitamin B12 level ? ?Please go to [facility] for your neck, shoulder and hand xray.   Their hours are 8am - 4:30 pm Monday - Friday.  Take your insurance card with you. ? ?[facility] ?[phone] ? ?[address], [address] ?[place], [place] [address] ? ?[address] ?[place], [place] [address] ? ? ?Cervical Radiculopathy ?Cervical radiculopathy means that a nerve in the neck (a cervical nerve) is pinched or bruised. This can happen because of an injury to the cervical spine (vertebrae) in the neck, or as a normal part of getting older. This condition can cause pain or loss of feeling (numbness) that runs from your neck all the way down to your arm and fingers. Often, this condition gets better with rest. Treatment may be needed if the condition does not get  better. ?What are the causes? ?A neck injury. ?A bulging disk in your spine. ?Sudden muscle tightening (muscle spasms). ?Tight muscles in your neck due to overuse. ?Arthritis. ?Breakdown in the bones and joints of the spine (spondylosis) due to getting older. ?Bone spurs that form near the nerves in the neck. ?What are the signs or symptoms? ?Pain. The pain may: ?Run from the neck to the arm and hand. ?Be very bad or irritating. ?Get worse when you move your neck. ?Loss of feeling or tingling in your arm or hand. ?Weakness in your arm or hand, in very bad cases. ?How is this treated? ?In many cases, treatment is not needed for this condition. With rest, the condition often gets better over time. If treatment is needed, options may include: ?Wearing a soft neck collar (cervical collar) for short periods of time. ?Doing exercises (physical therapy) to strengthen your neck muscles. ?Taking medicines. ?Having shots (injections) in your spine, in very bad cases. ?Having surgery. This may be needed if other treatments do not help. The type of surgery that is used will depend on the cause of your condition. ?Follow these instructions at home: ?If you have a soft neck collar: ?Wear it as told by your doctor. Take it off only as told by your doctor. ?Ask your doctor if you can take the collar off for cleaning and bathing. If you are allowed to take the collar off for cleaning or bathing: ?Follow instructions from your doctor about how  to take off the collar safely. ?Clean the collar by wiping it with mild soap and water and drying it completely. ?Take out any removable pads in the collar every 1-2 days. Wash them by hand with soap and water. Let them air-dry completely before you put them back in the collar. ?Check your skin under the collar for redness or sores. If you see any, tell your doctor. ?Managing pain ?  ?Take over-the-counter and prescription medicines only as told by your doctor. ?If told, put ice on the painful  area. To do this: ?If you have a soft neck collar, take if off as told by your doctor. ?Put ice in a plastic bag. ?Place a towel between your skin and the bag. ?Leave the ice on for 20 minutes, 2-3 times a day. ?Take off the ice if your skin turns bright red. This is very important. If you cannot feel pain, heat, or cold, you have a greater risk of damage to the area. ?If using ice does not help, you can try using heat. Use the heat source that your doctor recommends, such as a moist heat pack or a heating pad. ?Place a towel between your skin and the heat source. ?Leave the heat on for 20-30 minutes. ?Take off the heat if your skin turns bright red. This is very important. If you cannot feel pain, heat, or cold, you have a greater risk of getting burned. ?You may try a gentle neck and shoulder rub (massage). ?Activity ?Rest as needed. ?Return to your normal activities when your doctor says that it is safe. ?Do exercises as told by your doctor or physical therapist. ?You may have to avoid lifting. Ask your doctor how much you can safely lift. ?General instructions ?Use a flat pillow when you sleep. ?Do not drive while wearing a soft neck collar. If you do not have a soft neck collar, ask your doctor if it is safe to drive while your neck heals. ?Ask your doctor if you should avoid driving or using machines while you are taking your medicine. ?Do not smoke or use any products that contain nicotine or tobacco. If you need help quitting, ask your doctor. ?Keep all follow-up visits. ?Contact a doctor if: ?Your condition does not get better with treatment. ?Get help right away if: ?Your pain gets worse and medicine does not help. ?You lose feeling or feel weak in your hand, arm, face, or leg. ?You have a high fever. ?Your neck is stiff. ?You cannot control when you poop or pee (have incontinence). ?You have trouble with walking, balance, or talking. ?Summary ?Cervical radiculopathy means that a nerve in the neck is  pinched or bruised. ?A nerve can get pinched from a bulging disk, arthritis, an injury to the neck, or other causes. ?Symptoms include pain, tingling, or loss of feeling that goes from the neck to the arm or hand. ?Weakness in your arm or hand can happen in very bad cases. ?Treatment may include resting, wearing a soft neck collar, and doing exercises. You might need to take medicines for pain. In very bad cases, shots or surgery may be needed. ?This information is not intended to replace advice given to you by your health care provider. Make sure you discuss any questions you have with your health care provider. ?Document Revised: 04/13/2021 Document Reviewed: 04/13/2021 ?Elsevier Patient Education ? [employer] ? ?

## 2022-06-20 ENCOUNTER — Telehealth: Payer: Self-pay | Admitting: Medical

## 2022-06-20 ENCOUNTER — Encounter: Payer: 59 | Admitting: Medical

## 2022-06-20 NOTE — Telephone Encounter (Signed)

## 2022-06-21 ENCOUNTER — Encounter: Payer: Self-pay | Admitting: Medical

## 2022-08-20 ENCOUNTER — Encounter: Payer: Self-pay | Admitting: Medical

## 2022-08-20 ENCOUNTER — Ambulatory Visit (INDEPENDENT_AMBULATORY_CARE_PROVIDER_SITE_OTHER): Payer: Medicare Other | Admitting: Medical

## 2022-08-20 VITALS — BP 120/70 | HR 79 | Ht 65.0 in | Wt 164.8 lb

## 2022-08-20 DIAGNOSIS — I700 Atherosclerosis of aorta: Secondary | ICD-10-CM | POA: Diagnosis not present

## 2022-08-20 DIAGNOSIS — I10 Essential (primary) hypertension: Secondary | ICD-10-CM | POA: Diagnosis not present

## 2022-08-20 DIAGNOSIS — Z23 Encounter for immunization: Secondary | ICD-10-CM

## 2022-08-20 DIAGNOSIS — Z0000 Encounter for general adult medical examination without abnormal findings: Secondary | ICD-10-CM | POA: Diagnosis not present

## 2022-08-20 DIAGNOSIS — K219 Gastro-esophageal reflux disease without esophagitis: Secondary | ICD-10-CM | POA: Diagnosis not present

## 2022-08-20 DIAGNOSIS — J984 Other disorders of lung: Secondary | ICD-10-CM

## 2022-08-20 MED ORDER — VALSARTAN-HYDROCHLOROTHIAZIDE 160-12.5 MG PO TABS: 1.0000 | ORAL_TABLET | Freq: Every day | ORAL | 3 refills | Status: DC

## 2022-08-20 MED ORDER — OMEPRAZOLE 40 MG PO CPDR: 40.0000 mg | DELAYED_RELEASE_CAPSULE | Freq: Every day | ORAL | 3 refills | Status: DC

## 2022-08-20 MED ORDER — ROSUVASTATIN CALCIUM 10 MG PO TABS: 10.0000 mg | ORAL_TABLET | Freq: Every day | ORAL | 3 refills | Status: DC

## 2022-08-20 NOTE — Patient Instructions (Signed)
This visit was a preventative care visit, also known as wellness visit or routine physical.   Topics typically include healthy lifestyle, diet, exercise, preventative care, vaccinations, sick and well care, proper use of emergency dept and after hours care, as well as other concerns.     Recommendations: Continue to return yearly for your annual wellness and preventative care visits.  This gives [place] a chance to discuss healthy lifestyle, exercise, vaccinations, review your chart record, and perform screenings where appropriate.  I recommend you see your eye doctor yearly for routine vision care.  I recommend you see your dentist yearly for routine dental care including hygiene visits twice yearly.   Vaccination recommendations were reviewed Immunization History  Administered Date(s) Administered   Influenza,inj,Quad PF,6+ Mos 07/02/2017, 08/03/2018, 06/19/2021   Influenza-Unspecified 09/21/2016   PFIZER(Purple Top)SARS-COV-2 Vaccination 02/04/2020, 02/29/2020   Counseled on the influenza virus vaccine.  Vaccine information sheet given.  Influenza vaccine given after consent obtained.  You are due for other vaccines: Covid, Tetanus, Pneumococcal and Shingles vaccine   Screening for cancer: Colon cancer screening: I reviewed your colonoscopy on file that is up to date from 2019  We discussed PSA, prostate exam, and prostate cancer screening risks/benefits.     Skin cancer screening: Check your skin regularly for new changes, growing lesions, or other lesions of concern Come in for evaluation if you have skin lesions of concern.  Lung cancer screening: If you have a greater than 20 pack year history of tobacco use, then you may qualify for lung cancer screening with a chest CT scan.   Please call your insurance company to inquire about coverage for this test.  We currently don't have screenings for other cancers besides breast, cervical, colon, and lung cancers.  If you have a  strong family history of cancer or have other cancer screening concerns, please let me know.    Bone health: Get at least 150 minutes of aerobic exercise weekly Get weight bearing exercise at least once weekly Bone density test:  A bone density test is an imaging test that uses a type of X-ray to measure the amount of calcium and other minerals in your bones. The test may be used to diagnose or screen you for a condition that causes weak or thin bones (osteoporosis), predict your risk for a broken bone (fracture), or determine how well your osteoporosis treatment is working. The bone density test is recommended for females 80 and older, or females or males <52 if certain risk factors such as thyroid disease, long term use of steroids such as for asthma or rheumatological issues, vitamin D deficiency, estrogen deficiency, family history of osteoporosis, self or family history of fragility fracture in first degree relative.    Heart health: Get at least 150 minutes of aerobic exercise weekly Limit alcohol It is important to maintain a healthy blood pressure and healthy cholesterol numbers  Heart disease screening: Screening for heart disease includes screening for blood pressure, fasting lipids, glucose/diabetes screening, BMI height to weight ratio, reviewed of smoking status, physical activity, and diet.    Goals include blood pressure 120/80 or less, maintaining a healthy lipid/cholesterol profile, preventing diabetes or keeping diabetes numbers under good control, not smoking or using tobacco products, exercising most days per week or at least 150 minutes per week of exercise, and eating healthy variety of fruits and vegetables, healthy oils, and avoiding unhealthy food choices like fried food, fast food, high sugar and high cholesterol foods.    Other  tests may possibly include EKG test, CT coronary calcium score, echocardiogram, exercise treadmill stress test.   Consider consult with  cardiology.  You have prior scans showing cholesterol build up in aorta and coronary arteries.    Medical care options: I recommend you continue to seek care here first for routine care.  We try really hard to have available appointments Monday through Friday daytime hours for sick visits, acute visits, and physicals.  Urgent care should be used for after hours and weekends for significant issues that cannot wait till the next day.  The emergency department should be used for significant potentially life-threatening emergencies.  The emergency department is expensive, can often have long wait times for less significant concerns, so try to utilize primary care, urgent care, or telemedicine when possible to avoid unnecessary trips to the emergency department.  Virtual visits and telemedicine have been introduced since the pandemic started in 2020, and can be convenient ways to receive medical care.  We offer virtual appointments as well to assist you in a variety of options to seek medical care.   Separate significant issues discussed: High blood pressure - continue her current medicaiton  Atherosclerosis - continue cholesterol medicaiton.  Updated labs today.  Consider baseline cardiology consult  GERD - take Omeprazole 30 minutes before breakfast

## 2022-08-20 NOTE — Progress Notes (Signed)
Subjective:   HPI  Garrett Henry is a 66 y.o. male who presents for Chief Complaint  Patient presents with   nonfasting cpe    Nonfasting cpe, no concerns, flu shot today, declines covid    Patient Care Team: Rosanna Bickle, Camelia Eng, PA-C as PCP - General (Family Medicine) Sees dentist Sees eye doctor  Concerns: Overall doing ok  Nonfasting, ate rice about 4-5 hours ago, ate some rice ointment  Compliant with BP and cholesterol medication  Reviewed their medical, surgical, family, social, medication, and allergy history and updated chart as appropriate.  Past Medical History:  Diagnosis Date   Acute bronchitis    Adenomatous colon polyp    Allergy    Bronchitis    Cough    Diverticulosis    GERD (gastroesophageal reflux disease)    Hypertension    Internal hemorrhoids    Kidney stone    PNA (pneumonia)    Wears glasses     Past Surgical History:  Procedure Laterality Date   LUNG REMOVAL, PARTIAL Left 1995    Family History  Problem Relation Age of Onset   Stroke Father    Colon cancer Neg Hx    Rectal cancer Neg Hx    Stomach cancer Neg Hx      Current Outpatient Medications:    omeprazole (PRILOSEC) 40 MG capsule, Take 1 capsule (40 mg total) by mouth daily., Disp: 30 capsule, Rfl: 3   rosuvastatin (CRESTOR) 10 MG tablet, Take 1 tablet (10 mg total) by mouth daily., Disp: 90 tablet, Rfl: 3   valsartan-hydrochlorothiazide (DIOVAN-HCT) 160-12.5 MG tablet, Take 1 tablet by mouth daily., Disp: 90 tablet, Rfl: 3  Allergies  Allergen Reactions   Amoxicillin     Pressure in face   Lisinopril     cough    Review of Systems Constitutional: -fever, -chills, -sweats, -unexpected weight change, -decreased appetite, -fatigue Allergy: -sneezing, -itching, -congestion Dermatology: -changing moles, --rash, -lumps ENT: -runny nose, -ear pain, -sore throat, -hoarseness, -sinus pain, -teeth pain, - ringing in ears, -hearing loss, -nosebleeds Cardiology: -chest pain,  -palpitations, -swelling, -difficulty breathing when lying flat, -waking up short of breath Respiratory: -cough, -shortness of breath, -difficulty breathing with exercise or exertion, -wheezing, -coughing up blood Gastroenterology: -abdominal pain, -nausea, -vomiting, -diarrhea, -constipation, -blood in stool, -changes in bowel movement, -difficulty swallowing or eating Hematology: -bleeding, -bruising  Musculoskeletal: -joint aches, -muscle aches, -joint swelling, -back pain, -neck pain, -cramping, -changes in gait Ophthalmology: denies vision changes, eye redness, itching, discharge Urology: -burning with urination, -difficulty urinating, -blood in urine, -urinary frequency, -urgency, -incontinence Neurology: -headache, -weakness, -tingling, -numbness, -memory loss, -falls, -dizziness Psychology: -depressed mood, -agitation, -sleep problems Male GU: no testicular mass, pain, no lymph nodes swollen, no swelling, no rash.     08/20/2022    1:47 PM 12/25/2021   10:26 AM 06/19/2021    9:28 AM 01/23/2021    2:13 PM  Depression screen PHQ 2/9  Decreased Interest 0 0 0 0  Down, Depressed, Hopeless 0 0 0 0  PHQ - 2 Score 0 0 0 0        Objective:  BP 120/70   Pulse 79   Ht '5\' 5"'$  (1.651 m)   Wt 164 lb 12.8 oz (74.8 kg)   BMI 27.42 kg/m   General appearance: alert, no distress, WD/WN, Asian male Skin: unremarkable HEENT: normocephalic, conjunctiva/corneas normal, sclerae anicteric, PERRLA, EOMi, nares patent, no discharge or erythema, pharynx normal Oral cavity: MMM, tongue normal, teeth normal Neck: supple, no  lymphadenopathy, no thyromegaly, no masses, normal ROM, no bruits Chest: non tender, normal shape and expansion Heart: RRR, normal S1, S2, no murmurs Lungs: CTA bilaterally, no wheezes, rhonchi, or rales Abdomen: +bs, soft, non tender, non distended, no masses, no hepatomegaly, no splenomegaly, no bruits Back: non tender, normal ROM, no scoliosis Musculoskeletal: upper  extremities non tender, no obvious deformity, normal ROM throughout, lower extremities non tender, no obvious deformity, normal ROM throughout Extremities: no edema, no cyanosis, no clubbing Pulses: 2+ symmetric, upper and lower extremities, normal cap refill Neurological: alert, oriented x 3, CN2-12 intact, strength normal upper extremities and lower extremities, sensation normal throughout, DTRs 2+ throughout, no cerebellar signs, gait normal Psychiatric: normal affect, behavior normal, pleasant  GU/rectal - declined   Assessment and Plan :   Encounter Diagnoses  Name Primary?   Encounter for health maintenance examination in adult Yes   Essential hypertension, benign    Aortic atherosclerosis (HCC)    Gastroesophageal reflux disease, unspecified whether esophagitis present    Needs flu shot    Restrictive lung disease     This visit was a preventative care visit, also known as wellness visit or routine physical.   Topics typically include healthy lifestyle, diet, exercise, preventative care, vaccinations, sick and well care, proper use of emergency dept and after hours care, as well as other concerns.     Recommendations: Continue to return yearly for your annual wellness and preventative care visits.  This gives [place] a chance to discuss healthy lifestyle, exercise, vaccinations, review your chart record, and perform screenings where appropriate.  I recommend you see your eye doctor yearly for routine vision care.  I recommend you see your dentist yearly for routine dental care including hygiene visits twice yearly.   Vaccination recommendations were reviewed Immunization History  Administered Date(s) Administered   Fluad Quad(high Dose 65+) 08/20/2022   Influenza,inj,Quad PF,6+ Mos 07/02/2017, 08/03/2018, 06/19/2021   Influenza-Unspecified 09/21/2016   PFIZER(Purple Top)SARS-COV-2 Vaccination 02/04/2020, 02/29/2020   Counseled on the influenza virus vaccine.  Vaccine  information sheet given.  Influenza vaccine given after consent obtained.  You are due for other vaccines: Covid, Tetanus, Pneumococcal and Shingles vaccine   Screening for cancer: Colon cancer screening: I reviewed your colonoscopy on file that is up to date from 2019  We discussed PSA, prostate exam, and prostate cancer screening risks/benefits.     Skin cancer screening: Check your skin regularly for new changes, growing lesions, or other lesions of concern Come in for evaluation if you have skin lesions of concern.  Lung cancer screening: If you have a greater than 20 pack year history of tobacco use, then you may qualify for lung cancer screening with a chest CT scan.   Please call your insurance company to inquire about coverage for this test.  We currently don't have screenings for other cancers besides breast, cervical, colon, and lung cancers.  If you have a strong family history of cancer or have other cancer screening concerns, please let me know.    Bone health: Get at least 150 minutes of aerobic exercise weekly Get weight bearing exercise at least once weekly Bone density test:  A bone density test is an imaging test that uses a type of X-ray to measure the amount of calcium and other minerals in your bones. The test may be used to diagnose or screen you for a condition that causes weak or thin bones (osteoporosis), predict your risk for a broken bone (fracture), or determine how well  your osteoporosis treatment is working. The bone density test is recommended for females 66 and older, or females or males <40 if certain risk factors such as thyroid disease, long term use of steroids such as for asthma or rheumatological issues, vitamin D deficiency, estrogen deficiency, family history of osteoporosis, self or family history of fragility fracture in first degree relative.    Heart health: Get at least 150 minutes of aerobic exercise weekly Limit alcohol It is important  to maintain a healthy blood pressure and healthy cholesterol numbers  Heart disease screening: Screening for heart disease includes screening for blood pressure, fasting lipids, glucose/diabetes screening, BMI height to weight ratio, reviewed of smoking status, physical activity, and diet.    Goals include blood pressure 120/80 or less, maintaining a healthy lipid/cholesterol profile, preventing diabetes or keeping diabetes numbers under good control, not smoking or using tobacco products, exercising most days per week or at least 150 minutes per week of exercise, and eating healthy variety of fruits and vegetables, healthy oils, and avoiding unhealthy food choices like fried food, fast food, high sugar and high cholesterol foods.    Other tests may possibly include EKG test, CT coronary calcium score, echocardiogram, exercise treadmill stress test.   Consider consult with cardiology.  You have prior scans showing cholesterol build up in aorta and coronary arteries.    Medical care options: I recommend you continue to seek care here first for routine care.  We try really hard to have available appointments Monday through Friday daytime hours for sick visits, acute visits, and physicals.  Urgent care should be used for after hours and weekends for significant issues that cannot wait till the next day.  The emergency department should be used for significant potentially life-threatening emergencies.  The emergency department is expensive, can often have long wait times for less significant concerns, so try to utilize primary care, urgent care, or telemedicine when possible to avoid unnecessary trips to the emergency department.  Virtual visits and telemedicine have been introduced since the pandemic started in 2020, and can be convenient ways to receive medical care.  We offer virtual appointments as well to assist you in a variety of options to seek medical care.   Separate significant issues  discussed: High blood pressure - continue her current medicaiton  Atherosclerosis - continue cholesterol medicaiton.  Updated labs today.  Consider baseline cardiology consult  GERD - take Omeprazole 30 minutes before breakfast    Doniel was seen today for nonfasting cpe.  Diagnoses and all orders for this visit:  Encounter for health maintenance examination in adult -     Comprehensive metabolic panel -     CBC -     Lipid panel -     POCT Urinalysis DIP (Proadvantage Device)  Essential hypertension, benign  Aortic atherosclerosis (HCC) -     Lipid panel  Gastroesophageal reflux disease, unspecified whether esophagitis present  Needs flu shot -     Flu Vaccine QUAD High Dose(Fluad)  Restrictive lung disease  Other orders -     omeprazole (PRILOSEC) 40 MG capsule; Take 1 capsule (40 mg total) by mouth daily. -     valsartan-hydrochlorothiazide (DIOVAN-HCT) 160-12.5 MG tablet; Take 1 tablet by mouth daily. -     rosuvastatin (CRESTOR) 10 MG tablet; Take 1 tablet (10 mg total) by mouth daily.    Follow-up pending labs, yearly for physical

## 2022-08-21 ENCOUNTER — Other Ambulatory Visit: Payer: Self-pay | Admitting: Medical

## 2022-08-21 LAB — COMPREHENSIVE METABOLIC PANEL
ALT: 13 IU/L (ref 0–44)
AST: 24 IU/L (ref 0–40)
Albumin/Globulin Ratio: 1.6 (ref 1.2–2.2)
Albumin: 4.5 g/dL (ref 3.9–4.9)
Alkaline Phosphatase: 62 IU/L (ref 44–121)
BUN/Creatinine Ratio: 22 (ref 10–24)
BUN: 21 mg/dL (ref 8–27)
Bilirubin Total: 0.5 mg/dL (ref 0.0–1.2)
CO2: 29 mmol/L (ref 20–29)
Calcium: 9.1 mg/dL (ref 8.6–10.2)
Chloride: 99 mmol/L (ref 96–106)
Creatinine, Ser: 0.97 mg/dL (ref 0.76–1.27)
Globulin, Total: 2.8 g/dL (ref 1.5–4.5)
Glucose: 83 mg/dL (ref 70–99)
Potassium: 4.1 mmol/L (ref 3.5–5.2)
Sodium: 141 mmol/L (ref 134–144)
Total Protein: 7.3 g/dL (ref 6.0–8.5)
eGFR: 87 mL/min/{1.73_m2} (ref >59)

## 2022-08-21 LAB — LIPID PANEL
Chol/HDL Ratio: 3.2 ratio (ref 0.0–5.0)
Cholesterol, Total: 128 mg/dL (ref 100–199)
HDL: 40 mg/dL (ref >39)
LDL Chol Calc (NIH): 58 mg/dL (ref 0–99)
Triglycerides: 182 mg/dL — ABNORMAL HIGH (ref 0–149)
VLDL Cholesterol Cal: 30 mg/dL (ref 5–40)

## 2022-08-21 LAB — CBC
Hematocrit: 43.8 % (ref 37.5–51.0)
Hemoglobin: 13.8 g/dL (ref 13.0–17.7)
MCH: 24.3 pg — ABNORMAL LOW (ref 26.6–33.0)
MCHC: 31.5 g/dL (ref 31.5–35.7)
MCV: 77 fL — ABNORMAL LOW (ref 79–97)
Platelets: 198 10*3/uL (ref 150–450)
RBC: 5.67 x10E6/uL (ref 4.14–5.80)
RDW: 13.6 % (ref 11.6–15.4)
WBC: 7 10*3/uL (ref 3.4–10.8)

## 2022-10-30 ENCOUNTER — Ambulatory Visit: Payer: Medicare Other | Admitting: Internal Medicine

## 2022-10-30 ENCOUNTER — Encounter: Payer: Self-pay | Admitting: Internal Medicine

## 2022-10-30 VITALS — BP 155/74 | HR 78 | Ht 65.0 in | Wt 164.8 lb

## 2022-10-30 DIAGNOSIS — I10 Essential (primary) hypertension: Secondary | ICD-10-CM | POA: Diagnosis not present

## 2022-10-30 DIAGNOSIS — I209 Angina pectoris, unspecified: Secondary | ICD-10-CM | POA: Diagnosis not present

## 2022-10-30 DIAGNOSIS — E782 Mixed hyperlipidemia: Secondary | ICD-10-CM

## 2022-10-30 DIAGNOSIS — R079 Chest pain, unspecified: Secondary | ICD-10-CM | POA: Diagnosis not present

## 2022-10-30 MED ORDER — NIFEDIPINE ER OSMOTIC RELEASE 30 MG PO TB24: 30.0000 mg | ORAL_TABLET | Freq: Every day | ORAL | 3 refills | Status: DC

## 2022-10-30 MED ORDER — METOPROLOL SUCCINATE ER 25 MG PO TB24: 12.5000 mg | ORAL_TABLET | Freq: Every day | ORAL | 3 refills | Status: DC

## 2022-10-30 NOTE — Progress Notes (Signed)
Primary Physician/Referring:  Carlena Hurl, PA-C  Patient ID: Garrett Henry, male    DOB: [birth year], 67 y.o.   MRN: [id number]  Chief Complaint  Patient presents with   Abnormal ECG   Chest Pain   New Patient (Initial Visit)   HPI:    Garrett Henry  is a 67 y.o. male with past medical history significant for hypertension and hyperlipidemia who is here to establish care with cardiology.  Patient has been having chest pain and dyspnea on exertion lately which is a new issue for him.  His primary care provider noticed an abnormal EKG and referred him to [place].  Patient has never seen a cardiologist in the past and he denies cardiac history in himself.  His blood pressure is also uncontrolled today but patient states it always runs high when he goes to the doctor.  He has never had an echo or stress test before.  Patient denies palpitations, diaphoresis, syncope, claudication, edema, orthopnea, PND.  Past Medical History:  Diagnosis Date   Acute bronchitis    Adenomatous colon polyp    Allergy    Bronchitis    Cough    Diverticulosis    GERD (gastroesophageal reflux disease)    Hypertension    Internal hemorrhoids    Kidney stone    PNA (pneumonia)    Wears glasses    Past Surgical History:  Procedure Laterality Date   LUNG REMOVAL, PARTIAL Left 1995   Family History  Problem Relation Age of Onset   Stroke Father    Colon cancer Neg Hx    Rectal cancer Neg Hx    Stomach cancer Neg Hx     Social History   Tobacco Use   Smoking status: Former    Packs/day: 0.50    Years: 20.00    Total pack years: 10.00    Types: Cigarettes    Quit date: 10/22/1993    Years since quitting: 29.0   Smokeless tobacco: Never   Tobacco comments:    Former smoker.  Quit 1995.  20 year pack history.     Substance Use Topics   Alcohol use: No    Alcohol/week: 0.0 standard drinks of alcohol   Marital Status: Married  ROS  Review of Systems  Cardiovascular:  Positive for chest pain and dyspnea  on exertion.   Objective  Blood pressure (!) 155/74, pulse 78, height '5\' 5"'$  (1.651 m), weight 164 lb 12.8 oz (74.8 kg), SpO2 98 %. Body mass index is 27.42 kg/m.     11/02/2022    9:11 AM 10/30/2022   10:12 AM 10/30/2022   10:06 AM  Vitals with BMI  Height '5\' 5"'$   '5\' 5"'$   Weight 163 lbs 3 oz  164 lbs 13 oz  BMI 47.65  46.50  Systolic 354 656 812  Diastolic 60 74 77  Pulse 80 78 72     Physical Exam Vitals reviewed.  HENT:     Head: Normocephalic and atraumatic.  Cardiovascular:     Rate and Rhythm: Normal rate and regular rhythm.     Pulses: Normal pulses.     Heart sounds: Normal heart sounds. No murmur heard. Pulmonary:     Effort: Pulmonary effort is normal.     Breath sounds: Normal breath sounds.  Abdominal:     General: Bowel sounds are normal.  Musculoskeletal:     Right lower leg: No edema.     Left lower leg: No edema.  Skin:  General: Skin is warm and dry.  Neurological:     Mental Status: He is alert.     Medications and allergies   Allergies  Allergen Reactions   Amoxicillin Other (See Comments)    Pressure in face   Lisinopril     cough     Medication list after today's encounter   Current Outpatient Medications:    metoprolol succinate (TOPROL XL) 25 MG 24 hr tablet, Take 0.5 tablets (12.5 mg total) by mouth daily., Disp: 90 tablet, Rfl: 3   NIFEdipine (PROCARDIA-XL/NIFEDICAL-XL) 30 MG 24 hr tablet, Take 1 tablet (30 mg total) by mouth daily., Disp: 90 tablet, Rfl: 3   omeprazole (PRILOSEC) 40 MG capsule, Take 1 capsule (40 mg total) by mouth daily., Disp: 30 capsule, Rfl: 3   rosuvastatin (CRESTOR) 10 MG tablet, Take 1 tablet (10 mg total) by mouth daily., Disp: 90 tablet, Rfl: 3   valsartan-hydrochlorothiazide (DIOVAN-HCT) 160-12.5 MG tablet, Take 1 tablet by mouth daily., Disp: 90 tablet, Rfl: 3  Laboratory examination:   Lab Results  Component Value Date   NA 141 08/20/2022   K 4.1 08/20/2022   CO2 29 08/20/2022   GLUCOSE 83 08/20/2022    BUN 21 08/20/2022   CREATININE 0.97 08/20/2022   CALCIUM 9.1 08/20/2022   EGFR 87 08/20/2022   GFRNONAA >60 10/09/2018       Latest Ref Rng & Units 08/20/2022    2:30 PM 06/19/2021    9:58 AM 10/09/2018    6:54 PM  CMP  Glucose 70 - 99 mg/dL 83  91  117   BUN 8 - 27 mg/dL '21  26  15   '$ Creatinine 0.76 - 1.27 mg/dL 0.97  0.95  1.14   Sodium 134 - 144 mmol/L 141  140  138   Potassium 3.5 - 5.2 mmol/L 4.1  4.6  3.5   Chloride 96 - 106 mmol/L 99  100  103   CO2 20 - 29 mmol/L '29  25  21   '$ Calcium 8.6 - 10.2 mg/dL 9.1  9.5  9.3   Total Protein 6.0 - 8.5 g/dL 7.3  7.3    Total Bilirubin 0.0 - 1.2 mg/dL 0.5  0.5    Alkaline Phos 44 - 121 IU/L 62  72    AST 0 - 40 IU/L 24  24    ALT 0 - 44 IU/L 13  13        Latest Ref Rng & Units 08/20/2022    2:30 PM 06/19/2021    9:58 AM 10/09/2018    6:54 PM  CBC  WBC 3.4 - 10.8 x10E3/uL 7.0  7.8  11.4   Hemoglobin 13.0 - 17.7 g/dL 13.8  13.7  14.7   Hematocrit 37.5 - 51.0 % 43.8  44.1  45.1   Platelets 150 - 450 x10E3/uL 198  174  212     Lipid Panel Recent Labs    08/20/22 1430  CHOL 128  TRIG 182*  LDLCALC 58  HDL 40  CHOLHDL 3.2    HEMOGLOBIN A1C No results found for: "HGBA1C", "MPG" TSH No results for input(s): "TSH" in the last 8760 hours.  External labs:     Radiology:    Cardiac Studies:     EKG:   10/30/2022: Sinus Rhythm with early R wave progression. Normal EKG  Assessment     ICD-10-CM   1. Chest pain of uncertain etiology  X64.6 EKG 12-Lead    PCV ECHOCARDIOGRAM COMPLETE  PCV MYOCARDIAL PERFUSION WO LEXISCAN    2. Essential hypertension  I10 PCV ECHOCARDIOGRAM COMPLETE    PCV MYOCARDIAL PERFUSION WO LEXISCAN    3. Mixed hyperlipidemia  E78.2 PCV ECHOCARDIOGRAM COMPLETE    PCV MYOCARDIAL PERFUSION WO LEXISCAN    4. Angina pectoris (HCC)  I20.9 PCV ECHOCARDIOGRAM COMPLETE    PCV MYOCARDIAL PERFUSION WO LEXISCAN       Orders Placed This Encounter  Procedures   PCV MYOCARDIAL PERFUSION WO  LEXISCAN    Standing Status:   Future    Standing Expiration Date:   12/29/2022   EKG 12-Lead   PCV ECHOCARDIOGRAM COMPLETE    Standing Status:   Future    Standing Expiration Date:   10/31/2023    Meds ordered this encounter  Medications   metoprolol succinate (TOPROL XL) 25 MG 24 hr tablet    Sig: Take 0.5 tablets (12.5 mg total) by mouth daily.    Dispense:  90 tablet    Refill:  3   NIFEdipine (PROCARDIA-XL/NIFEDICAL-XL) 30 MG 24 hr tablet    Sig: Take 1 tablet (30 mg total) by mouth daily.    Dispense:  90 tablet    Refill:  3    There are no discontinued medications.   Recommendations:   Keifer Little is a 67 y.o.  male with chest pain on exertion  Angina pectoris ([place]) Will add nifedipine to current regiment Stress test and echocardiogram ordered Patient instructed not to do heavy lifting, heavy exertional activity, swimming until evaluation is complete.  Patient instructed to call if symptoms worse or to go to the ED for further evaluation.   Essential hypertension Continue current cardiac medications with the addition of toproil and nifedipine. Encourage low-sodium diet, less than 2000 mg daily. Follow-up in 1-2 months or sooner if needed.   Mixed hyperlipidemia Continue crestor Primary following       Floydene Flock, DO, [facility]  11/03/2022, 2:12 PM Office: [phone] Pager: [phone]

## 2022-11-02 ENCOUNTER — Encounter: Payer: Self-pay | Admitting: Internal Medicine

## 2022-11-02 ENCOUNTER — Ambulatory Visit: Payer: Medicare Other | Admitting: Internal Medicine

## 2022-11-02 ENCOUNTER — Ambulatory Visit (INDEPENDENT_AMBULATORY_CARE_PROVIDER_SITE_OTHER)
Admission: RE | Admit: 2022-11-02 | Discharge: 2022-11-02 | Disposition: A | Discharge status: Home / Self Care | Payer: Medicare Other | Source: Ambulatory Visit | Attending: Internal Medicine | Admitting: Internal Medicine

## 2022-11-02 VITALS — BP 116/60 | HR 80 | Temp 97.8°F | Ht 65.0 in | Wt 163.2 lb

## 2022-11-02 DIAGNOSIS — R0789 Other chest pain: Secondary | ICD-10-CM | POA: Diagnosis not present

## 2022-11-02 DIAGNOSIS — R079 Chest pain, unspecified: Secondary | ICD-10-CM | POA: Diagnosis not present

## 2022-11-02 NOTE — Patient Instructions (Signed)
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 Frequently these patients have had multiple negative GI workups and CT scans.  Treatment consists of avoiding foods that cause gas (especially boiled eggs, mexcican food but especially  beans and undercooked vegetables like  spinach and some salads)  and citrucel 1 heaping tsp twice daily with a large glass of water.  Pain should improve w/in 2 weeks and if not then consider further GI work up.     Please remember to go to the  x-ray department  at [facility] (where GI)  - we will call you with the results when they are available    If not better in 2 weeks, call for referral to Dr Loanne Drilling

## 2022-11-02 NOTE — Progress Notes (Unsigned)
Garrett Henry, male    DOB: 1956/03/14   MRN: [id number]   Brief patient profile:  43  vietnamese  quit 1995 at time of L lung surgery by Arlyce Dice "for nodule" referred to pulmonary clinic 11/02/2022 by Shanon Rosser  for atypical cp.  Note seen by Dr Tera Partridge for atypical cp in 2017 with mild restrictive changes on pfts and CT chest 12/05/2016:  Postoperative changes in the left upper lobe with scarring in the left upper lobe. Bibasilar scarring. Calcified pleural plaque along the right hemidiaphragm.     History of Present Illness  11/02/2022  Pulmonary/ 1st office eval/Aarion Metzgar  Chief Complaint  Patient presents with   Consult    Chest pain and cough when pt is exposed to cold weather.  Sx since November 2023.  Cardiologist eval last week.  Pt thinks normal exam. Does have stress test scheduled.    Dyspnea:  denies limiting sob  Cough: denies/ no worse pain with cough  Sleep: does not wake him up  SABA use: none   No obvious other day to day or daytime pattern to variability or assoc excess/ purulent sputum or mucus plugs or hemoptysis or   subjective wheeze or overt sinus or hb symptoms.   Sleeping flat  without nocturnal  or early am exacerbation  of respiratory  c/o's or need for noct saba. Also denies any obvious fluctuation of symptoms with weather or environmental changes or other aggravating or alleviating factors except as outlined above   No unusual exposure hx or h/o childhood pna/ asthma or knowledge of premature birth.  Current Allergies, Complete Past Medical History, Past Surgical History, Family History, and Social History were reviewed in [employer] record.  ROS  The following are not active complaints unless bolded Hoarseness, sore throat, dysphagia, dental problems, itching, sneezing,  nasal congestion or discharge of excess mucus or purulent secretions, ear ache,   fever, chills, sweats, unintended wt loss or wt gain, classically pleuritic or  exertional cp,  orthopnea pnd or arm/hand swelling  or leg swelling, presyncope, palpitations, abdominal pain, anorexia, nausea, vomiting, diarrhea  or change in bowel habits or change in bladder habits, change in stools or change in urine, dysuria, hematuria,  rash, arthralgias, visual complaints, headache, numbness, weakness or ataxia or problems with walking or coordination,  change in mood or  memory.           Past Medical History:  Diagnosis Date   Acute bronchitis    Adenomatous colon polyp    Allergy    Bronchitis    Cough    Diverticulosis    GERD (gastroesophageal reflux disease)    Hypertension    Internal hemorrhoids    Kidney stone    PNA (pneumonia)    Wears glasses     Outpatient Medications Prior to Visit  Medication Sig Dispense Refill   metoprolol succinate (TOPROL XL) 25 MG 24 hr tablet Take 0.5 tablets (12.5 mg total) by mouth daily. 90 tablet 3   NIFEdipine (PROCARDIA-XL/NIFEDICAL-XL) 30 MG 24 hr tablet Take 1 tablet (30 mg total) by mouth daily. 90 tablet 3   omeprazole (PRILOSEC) 40 MG capsule Take 1 capsule (40 mg total) by mouth daily. 30 capsule 3   rosuvastatin (CRESTOR) 10 MG tablet Take 1 tablet (10 mg total) by mouth daily. 90 tablet 3   valsartan-hydrochlorothiazide (DIOVAN-HCT) 160-12.5 MG tablet Take 1 tablet by mouth daily. 90 tablet 3   No facility-administered medications prior to visit.  Objective:     BP 116/60 (BP Location: Left Arm, Patient Position: Sitting, Cuff Size: Normal)   Pulse 80   Temp 97.8 F (36.6 C) (Oral)   Ht '5\' 5"'$  (1.651 m)   Wt 163 lb 3.2 oz (74 kg)   SpO2 100%   BMI 27.16 kg/m   SpO2: 100 %  Amb vietnamese male very difficult with concepts of pattern/ duration of pain/ tiggers etc   HEENT : Oropharynx  clear      NECK :  without  apparent JVD/ palpable Nodes/TM    LUNGS: no acc muscle use,  Nl contour chest with nl  scars on L but clear to A and P bilaterally without cough on insp or exp maneuvers     CV:  RRR  no s3 or murmur or increase in P2, and no edema   ABD:  soft and nontender with nl inspiratory excursion in the supine position. No bruits or organomegaly appreciated   MS:  Nl gait/ ext warm without deformities Or obvious joint restrictions  calf tenderness, cyanosis or clubbing    SKIN: warm and dry without lesions    NEURO:  alert, approp, nl sensorium with  no motor or cerebellar deficits apparent.         Assessment   No problem-specific Assessment & Plan notes found for this encounter.     Christinia Gully, MD 11/02/2022

## 2022-11-03 ENCOUNTER — Encounter: Payer: Self-pay | Admitting: Internal Medicine

## 2022-11-03 NOTE — Assessment & Plan Note (Addendum)
Onset 2017 initial w/u by Dr Ashok Cordia not revealing   worse in LUQ / upright / never noct  - 11/02/2022   Walked on RA  x  3  lap(s) =  approx 750  ft  @ mod fast pace, stopped due to end of study  with lowest 02 sats 98% and no cp or sob  p experiencing cp at rest during ov  -  11/02/2022 rec trial rx for IBS/ splenic flexure syndrome and f/u with Dr Loanne Drilling if not improving   Very difficult to get any specific information because he kept repeating "you should know, I don't" when asked about his symptoms  "what is the shortest you've ever experienced the pain"  "what is the longest" and this may be a language vs cultural challenge going forward in terms of response to empirical rx so will rx as IBS for at least 2 weeks and then ask Dr Loanne Drilling who speaks vietnamese to see pt next if not responding to above rx.  Each maintenance medication was reviewed in detail including emphasizing most importantly the difference between maintenance and prns and under what circumstances the prns are to be triggered using an action plan format where appropriate.  Total time for H and P, chart review, counseling,  directly observing portions of ambulatory 02 saturation study/ and generating customized AVS unique to this office visit / same day charting > 45 min with new pt and refractory chronic chest symptoms

## 2022-11-05 ENCOUNTER — Telehealth: Payer: Self-pay

## 2022-11-05 NOTE — Telephone Encounter (Signed)
-----  Message from Tanda Rockers, MD sent at 11/04/2022  9:50 AM EST ----- Call pt:  Reviewed cxr and no acute change so no change in recommendations made at [facility]

## 2022-11-05 NOTE — Telephone Encounter (Signed)
Called Pt to give results of chest xray. Pt stated understanding and nothing further needed.

## 2022-11-12 ENCOUNTER — Ambulatory Visit: Payer: Medicare Other

## 2022-11-12 DIAGNOSIS — I10 Essential (primary) hypertension: Secondary | ICD-10-CM

## 2022-11-12 DIAGNOSIS — E782 Mixed hyperlipidemia: Secondary | ICD-10-CM

## 2022-11-12 DIAGNOSIS — I209 Angina pectoris, unspecified: Secondary | ICD-10-CM | POA: Diagnosis not present

## 2022-11-12 DIAGNOSIS — R079 Chest pain, unspecified: Secondary | ICD-10-CM | POA: Diagnosis not present

## 2022-11-14 ENCOUNTER — Telehealth: Payer: Self-pay | Admitting: Internal Medicine

## 2022-11-14 NOTE — Telephone Encounter (Signed)
Ok how can his insurance not cover when I documented chest pain on exertion

## 2022-11-14 NOTE — Telephone Encounter (Signed)
Can you please message her I'm about to see a pt

## 2022-11-14 NOTE — Telephone Encounter (Signed)
Patient has cancelled both nuc stress test and follow up appointment due to a letter he received stating insurance will not cover his visits. Please advise.

## 2022-11-14 NOTE — Telephone Encounter (Signed)
I'm not sure what I can do in this situation?

## 2022-11-14 NOTE — Telephone Encounter (Signed)
Can someone start appeal process

## 2022-11-16 ENCOUNTER — Other Ambulatory Visit: Payer: Self-pay | Admitting: Medical

## 2022-11-19 ENCOUNTER — Other Ambulatory Visit: Payer: Medicare Other

## 2022-12-10 ENCOUNTER — Ambulatory Visit: Payer: Medicare Other | Admitting: Internal Medicine

## 2022-12-10 DIAGNOSIS — H524 Presbyopia: Secondary | ICD-10-CM | POA: Diagnosis not present

## 2022-12-10 DIAGNOSIS — H52223 Regular astigmatism, bilateral: Secondary | ICD-10-CM | POA: Diagnosis not present

## 2022-12-10 DIAGNOSIS — H5203 Hypermetropia, bilateral: Secondary | ICD-10-CM | POA: Diagnosis not present

## 2022-12-10 DIAGNOSIS — H2513 Age-related nuclear cataract, bilateral: Secondary | ICD-10-CM | POA: Diagnosis not present

## 2022-12-17 DIAGNOSIS — H2513 Age-related nuclear cataract, bilateral: Secondary | ICD-10-CM | POA: Diagnosis not present

## 2022-12-17 DIAGNOSIS — H40033 Anatomical narrow angle, bilateral: Secondary | ICD-10-CM | POA: Diagnosis not present

## 2023-02-13 ENCOUNTER — Telehealth: Payer: Self-pay | Admitting: Medical

## 2023-02-13 NOTE — Telephone Encounter (Signed)
Called patient to schedule Medicare Annual Wellness Visit (AWV). Left message for patient to call back and schedule Medicare Annual Wellness Visit (AWV).  Last date of AWV: due 01/21/23 awvi per [employer]  Please schedule an appointment at any time with [facility].  If any questions, please contact me at [phone].  Thank you ,  Rudell Cobb AWV direct phone # [phone]

## 2023-05-12 ENCOUNTER — Other Ambulatory Visit: Payer: Self-pay | Admitting: Medical

## 2023-08-07 ENCOUNTER — Other Ambulatory Visit: Payer: Self-pay | Admitting: Medical

## 2023-08-26 ENCOUNTER — Ambulatory Visit (INDEPENDENT_AMBULATORY_CARE_PROVIDER_SITE_OTHER): Payer: Medicare Other | Admitting: Medical

## 2023-08-26 ENCOUNTER — Encounter: Payer: Self-pay | Admitting: Medical

## 2023-08-26 VITALS — BP 122/78 | HR 74 | Ht 65.0 in | Wt 165.4 lb

## 2023-08-26 DIAGNOSIS — I700 Atherosclerosis of aorta: Secondary | ICD-10-CM | POA: Diagnosis not present

## 2023-08-26 DIAGNOSIS — K219 Gastro-esophageal reflux disease without esophagitis: Secondary | ICD-10-CM | POA: Diagnosis not present

## 2023-08-26 DIAGNOSIS — R0789 Other chest pain: Secondary | ICD-10-CM | POA: Diagnosis not present

## 2023-08-26 DIAGNOSIS — Z1389 Encounter for screening for other disorder: Secondary | ICD-10-CM

## 2023-08-26 DIAGNOSIS — Z23 Encounter for immunization: Secondary | ICD-10-CM

## 2023-08-26 DIAGNOSIS — I10 Essential (primary) hypertension: Secondary | ICD-10-CM | POA: Diagnosis not present

## 2023-08-26 DIAGNOSIS — Z0000 Encounter for general adult medical examination without abnormal findings: Secondary | ICD-10-CM

## 2023-08-26 DIAGNOSIS — Z125 Encounter for screening for malignant neoplasm of prostate: Secondary | ICD-10-CM

## 2023-08-26 LAB — POCT URINALYSIS DIP (PROADVANTAGE DEVICE)
Bilirubin, UA: NEGATIVE
Blood, UA: NEGATIVE
Glucose, UA: NEGATIVE mg/dL
Ketones, POC UA: NEGATIVE mg/dL
Leukocytes, UA: NEGATIVE
Nitrite, UA: NEGATIVE
Protein Ur, POC: NEGATIVE mg/dL
Specific Gravity, Urine: 1.02
Urobilinogen, Ur: NEGATIVE
pH, UA: 6 (ref 5.0–8.0)

## 2023-08-26 NOTE — Progress Notes (Signed)
Subjective:   HPI  Garrett Henry is a 67 y.o. male who presents for Chief Complaint  Patient presents with   Annual Exam    Fasting cpe, sometimes when lifting things, he will have chest discomfort 2 days later. When he sneezes, he will have chest discomfort. Would like     Patient Care Team: Alannis Hsia, Cleda Mccreedy as PCP - General (Family Medicine) Dr. Erick Blinks, GI Dr. Lawanda Cousins, pulmonology Dr. Tobie Lords, urology Dr. Clotilde Dieter, cardiology   Concerns: Here for well visit.  He gets intermittent short of breath lifting heavy things or sneezing.  He saw pulmonology in January for the same.  They thought that was more gastric related.  Chest x-ray was normal at that time.  is intermittent that happens, not regularly.  Otherwise in normal state of health  Reviewed their medical, surgical, family, social, medication, and allergy history and updated chart as appropriate.  Allergies  Allergen Reactions   Amoxicillin Other (See Comments)    Pressure in face   Lisinopril     cough    Past Medical History:  Diagnosis Date   Adenomatous colon polyp    Allergy    Diverticulosis    GERD (gastroesophageal reflux disease)    Hyperlipidemia    Hypertension    Internal hemorrhoids    Kidney stone    PNA (pneumonia)    Wears glasses     Current Outpatient Medications on File Prior to Visit  Medication Sig Dispense Refill   omeprazole (PRILOSEC) 40 MG capsule TAKE 1 CAPSULE (40 MG TOTAL) BY MOUTH DAILY. 90 capsule 0   rosuvastatin (CRESTOR) 10 MG tablet Take 1 tablet (10 mg total) by mouth daily. 90 tablet 3   valsartan-hydrochlorothiazide (DIOVAN-HCT) 160-12.5 MG tablet Take 1 tablet by mouth daily. 90 tablet 3   No current facility-administered medications on file prior to visit.      Current Outpatient Medications:    omeprazole (PRILOSEC) 40 MG capsule, TAKE 1 CAPSULE (40 MG TOTAL) BY MOUTH DAILY., Disp: 90 capsule, Rfl: 0   rosuvastatin (CRESTOR) 10 MG  tablet, Take 1 tablet (10 mg total) by mouth daily., Disp: 90 tablet, Rfl: 3   valsartan-hydrochlorothiazide (DIOVAN-HCT) 160-12.5 MG tablet, Take 1 tablet by mouth daily., Disp: 90 tablet, Rfl: 3  Family History  Problem Relation Age of Onset   Stroke Father    Colon cancer Neg Hx    Rectal cancer Neg Hx    Stomach cancer Neg Hx     Past Surgical History:  Procedure Laterality Date   LUNG REMOVAL, PARTIAL Left 1995    Review of Systems  Constitutional:  Negative for chills, fever, malaise/fatigue and weight loss.  HENT:  Negative for congestion, ear pain, hearing loss, sore throat and tinnitus.   Eyes:  Negative for blurred vision, pain and redness.  Respiratory:  Positive for shortness of breath. Negative for cough and hemoptysis.   Cardiovascular:  Negative for chest pain, palpitations, orthopnea, claudication and leg swelling.  Gastrointestinal:  Negative for abdominal pain, blood in stool, constipation, diarrhea, nausea and vomiting.  Genitourinary:  Negative for dysuria, flank pain, frequency, hematuria and urgency.  Musculoskeletal:  Negative for falls, joint pain and myalgias.  Skin:  Negative for itching and rash.  Neurological:  Negative for dizziness, tingling, speech change, weakness and headaches.  Endo/Heme/Allergies:  Negative for polydipsia. Does not bruise/bleed easily.  Psychiatric/Behavioral:  Negative for depression and memory loss. The patient is not nervous/anxious and does not have insomnia.  Objective:  BP 122/78   Pulse 74   Ht 5\' 5"  (1.651 m)   Wt 165 lb 6.4 oz (75 kg)   BMI 27.52 kg/m   General appearance: alert, no distress, WD/WN, Asian male Skin: faded tattoo left anterior forearm, otherwise skin unremarkable HEENT: normocephalic, conjunctiva/corneas normal, sclerae anicteric, PERRLA, EOMi, nares patent, no discharge or erythema, pharynx normal Oral cavity: MMM, tongue normal, teeth - missing several molars, otherwise in relatively good  repair Neck: supple, no lymphadenopathy, no thyromegaly, no masses, normal ROM, no bruits Chest: non tender, normal shape and expansion Heart: RRR, normal S1, S2, no murmurs Lungs: CTA bilaterally, no wheezes, rhonchi, or rales Abdomen: +bs, soft, non tender, non distended, no masses, no hepatomegaly, no splenomegaly, no bruits Back: non tender, normal ROM, no scoliosis Musculoskeletal: upper extremities non tender, no obvious deformity, normal ROM throughout, lower extremities non tender, no obvious deformity, normal ROM throughout Extremities: no edema, no cyanosis, no clubbing Pulses: 2+ symmetric, upper and lower extremities, normal cap refill Neurological: alert, oriented x 3, CN2-12 intact, strength normal upper extremities and lower extremities, sensation normal throughout, DTRs 2+ throughout, no cerebellar signs, gait normal Psychiatric: normal affect, behavior normal, pleasant  GU:/rectal - declined/deferred   Assessment and Plan :   Encounter Diagnoses  Name Primary?   Encounter for health maintenance examination in adult Yes   Needs flu shot    Need for pneumococcal 20-valent conjugate vaccination    Screening for prostate cancer    Essential hypertension, benign    Aortic atherosclerosis (HCC)    Gastroesophageal reflux disease, unspecified whether esophagitis present    Atypical chest pain    Screening for hematuria or proteinuria     This visit was a preventative care visit, also known as wellness visit or routine physical.   Topics typically include healthy lifestyle, diet, exercise, preventative care, vaccinations, sick and well care, proper use of emergency dept and after hours care, as well as other concerns.     Separate significant issues discussed: If you continue to have discomfort in chest or problems breathing I can refer you back to lung doctor  GERD - you can use over the counter Pepcid as needed in aditional to your Omeprozole/Prilosec reflux medication.    Avoid eating close to bedtime.  Do not registered meals.  Limit or avoid spicy foods, hot sauce, citrus and tomato-based foods.  Hypertension-Continue valsartan HCT 160/12.5 mg daily   Hyperlipidemia-continue rosuvastatin 10 mg daily   General Recommendations: Continue to return yearly for your annual wellness and preventative care visits.  This gives [place] a chance to discuss healthy lifestyle, exercise, vaccinations, review your chart record, and perform screenings where appropriate.  I recommend you see your eye doctor yearly for routine vision care.  I recommend you see your dentist yearly for routine dental care including hygiene visits twice yearly.   Vaccination  Immunization History  Administered Date(s) Administered   Fluad Quad(high Dose 65+) 08/20/2022   Fluad Trivalent(High Dose 65+) 08/26/2023   Influenza,inj,Quad PF,6+ Mos 07/02/2017, 08/03/2018, 06/19/2021   Influenza-Unspecified 09/21/2016   PFIZER(Purple Top)SARS-COV-2 Vaccination 02/04/2020, 02/29/2020   PNEUMOCOCCAL CONJUGATE-20 08/26/2023    Counseled on the influenza virus vaccine.  Vaccine information sheet given.  Influenza vaccine given after consent obtained.  Counseled on the pneumococcal vaccine.  Vaccine information sheet given.  Pneumococcal vaccine Prevnar 20 given after consent obtained.  Consider updating Tdap tetanus booster at your pharmacy   Screening for cancer: Colon cancer screening: You decline screening at  this time   Prostate Cancer screening: The recommended prostate cancer screening test is a blood test called the prostate-specific antigen (PSA) test. PSA is a protein that is made in the prostate. As you age, your prostate naturally produces more PSA. Abnormally high PSA levels may be caused by: Prostate cancer. An enlarged prostate that is not caused by cancer (benign prostatic hyperplasia, or BPH). This condition is very common in older men. A prostate gland infection (prostatitis)  or urinary tract infection. Certain medicines such as male hormones (like testosterone) or other medicines that raise testosterone levels. A rectal exam may be done as part of prostate cancer screening to help provide information about the size of your prostate gland. When a rectal exam is performed, it should be done after the PSA level is drawn to avoid any effect on the results.   Skin cancer screening: Check your skin regularly for new changes, growing lesions, or other lesions of concern Come in for evaluation if you have skin lesions of concern.   Lung cancer screening: If you have a greater than 20 pack year history of tobacco use, then you may qualify for lung cancer screening with a chest CT scan.   Please call your insurance company to inquire about coverage for this test.   Pancreatic cancer:  no current screening test is available or routinely recommended. (risk factors: smoking, overweight or obese, diabetes, chronic pancreatitis, work exposure - dry cleaning, metal working, 67yo>, M>F, Tree surgeon, family hx/o, hereditary breast, ovarian, melanoma, lynch, peutz-jeghers).  Symptoms: jaundice, dark urine, light color or greasy stools, itchy skin, belly or back pain, weight loss, poor appetite, nausea, vomiting, liver enlargement, DVT/blood clots.   We currently don't have screenings for other cancers besides breast, cervical, colon, and lung cancers.  If you have a strong family history of cancer or have other cancer screening concerns, please let me know.  Genetic testing referral is an option for individuals with high cancer risk in the family.  There are some other cancer screenings in development currently.   Bone health: Get at least 150 minutes of aerobic exercise weekly Get weight bearing exercise at least once weekly Bone density test:  A bone density test is an imaging test that uses a type of X-ray to measure the amount of calcium and other minerals in your  bones. The test may be used to diagnose or screen you for a condition that causes weak or thin bones (osteoporosis), predict your risk for a broken bone (fracture), or determine how well your osteoporosis treatment is working. The bone density test is recommended for females 65 and older, or females or males <65 if certain risk factors such as thyroid disease, long term use of steroids such as for asthma or rheumatological issues, vitamin D deficiency, estrogen deficiency, family history of osteoporosis, self or family history of fragility fracture in first degree relative.    Heart health: Get at least 150 minutes of aerobic exercise weekly Limit alcohol It is important to maintain a healthy blood pressure and healthy cholesterol numbers  Heart disease screening: Screening for heart disease includes screening for blood pressure, fasting lipids, glucose/diabetes screening, BMI height to weight ratio, reviewed of smoking status, physical activity, and diet.    Goals include blood pressure 120/80 or less, maintaining a healthy lipid/cholesterol profile, preventing diabetes or keeping diabetes numbers under good control, not smoking or using tobacco products, exercising most days per week or at least 150 minutes per week of exercise, and  eating healthy variety of fruits and vegetables, healthy oils, and avoiding unhealthy food choices like fried food, fast food, high sugar and high cholesterol foods.    Other tests may possibly include EKG test, CT coronary calcium score, echocardiogram, exercise treadmill stress test.     10/2022 echocardiogram reviewed, EF 55-60%, mild regurg mitral and tricuspid, indeterminate diastolic fillling pattern.   Vascular disease screening: For higher risk individuals including smokers, diabetics, patients with known heart disease or high blood pressure, kidney disease, and others, screening for vascular disease or atherosclerosis of the arteries is available.   Examples may include carotid ultrasound, abdominal aortic ultrasound, ABI blood flow screening in the legs, thoracic aorta screening.   Medical care options: I recommend you continue to seek care here first for routine care.  We try really hard to have available appointments Monday through Friday daytime hours for sick visits, acute visits, and physicals.  Urgent care should be used for after hours and weekends for significant issues that cannot wait till the next day.  The emergency department should be used for significant potentially life-threatening emergencies.  The emergency department is expensive, can often have long wait times for less significant concerns, so try to utilize primary care, urgent care, or telemedicine when possible to avoid unnecessary trips to the emergency department.  Virtual visits and telemedicine have been introduced since the pandemic started in 2020, and can be convenient ways to receive medical care.  We offer virtual appointments as well to assist you in a variety of options to seek medical care.   Legal  Take the time to do a last will and testament, Advanced Directives including Health Care Power of Attorney and Living Will documents.  Don't leave your family with burdens that can be handled ahead of time.   Advanced Directives: I recommend you consider completing a Health Care Power of Attorney and Living Will.   These documents respect your wishes and help alleviate burdens on your loved ones if you were to become terminally ill or be in a position to need those documents enforced.    You can complete Advanced Directives yourself, have them notarized, then have copies made for our office, for you and for anybody you feel should have them in safe keeping.  Or, you can have an attorney prepare these documents.   If you haven't updated your Last Will and Testament in a while, it may be worthwhile having an attorney prepare these documents together and save on some  costs.       Spiritual and Emotional Health Keeping a healthy spiritual life can help you better manage your physical health. Your spiritual life can help you to cope with any issues that may arise with your physical health.  Balance can keep [place] healthy and help [place] to recover.  If you are struggling with your spiritual health there are questions that you may want to ask yourself:  What makes me feel most complete? When do I feel most connected to the rest of the world? Where do I find the most inner strength? What am I doing when I feel whole?  Helpful tips: Being in nature. Some people feel very connected and at peace when they are walking outdoors or are outside. Helping others. Some feel the largest sense of wellbeing when they are of service to others. Being of service can take on many forms. It can be doing volunteer work, being kind to strangers, or offering a hand to a friend in  need. Gratitude. Some people find they feel the most connected when they remain grateful. They may make lists of all the things they are grateful for or say a thank you out loud for all they have.    Emotional Health Are you in tune with your emotional health?  Check out this link: http://www.marquez-love.com/    Financial Health Make sure you use a budget for your personal finances Make sure you are insured against risks (health insurance, life insurance, auto insurance, etc) Save more, spend less Set financial goals If you need help in this area, good resources include counseling through [employer] or other community resources, have a meeting with a Social research officer, government, and a good resource is the [employer] was seen today for annual exam.  Diagnoses and all orders for this visit:  Encounter for health maintenance examination in adult -     Comprehensive metabolic panel -     CBC -     Lipid panel -     PSA -     POCT Urinalysis DIP (Proadvantage  Device)  Needs flu shot -     Flu Vaccine Trivalent High Dose (Fluad)  Need for pneumococcal 20-valent conjugate vaccination -     Pneumococcal conjugate vaccine 20-valent (Prevnar 20)  Screening for prostate cancer -     PSA  Essential hypertension, benign  Aortic atherosclerosis (HCC) -     Lipid panel  Gastroesophageal reflux disease, unspecified whether esophagitis present  Atypical chest pain  Screening for hematuria or proteinuria -     POCT Urinalysis DIP (Proadvantage Device)     Follow-up pending labs, yearly for physical

## 2023-08-26 NOTE — Patient Instructions (Signed)
This visit was a preventative care visit, also known as wellness visit or routine physical.   Topics typically include healthy lifestyle, diet, exercise, preventative care, vaccinations, sick and well care, proper use of emergency dept and after hours care, as well as other concerns.     Separate significant issues discussed: If you continue to have discomfort in chest or problems breathing I can refer you back to lung doctor  GERD - you can use over the counter Pepcid as needed in aditional to your Omeprozole/Prilosec reflux medication.   Avoid eating close to bedtime.  Do not registered meals.  Limit or avoid spicy foods, hot sauce, citrus and tomato-based foods.  Hypertension-Continue valsartan HCT 160/12.5 mg daily   Hyperlipidemia-continue rosuvastatin 10 mg daily   General Recommendations: Continue to return yearly for your annual wellness and preventative care visits.  This gives [place] a chance to discuss healthy lifestyle, exercise, vaccinations, review your chart record, and perform screenings where appropriate.  I recommend you see your eye doctor yearly for routine vision care.  I recommend you see your dentist yearly for routine dental care including hygiene visits twice yearly.   Vaccination  Immunization History  Administered Date(s) Administered   Fluad Quad(high Dose 65+) 08/20/2022   Influenza,inj,Quad PF,6+ Mos 07/02/2017, 08/03/2018, 06/19/2021   Influenza-Unspecified 09/21/2016   PFIZER(Purple Top)SARS-COV-2 Vaccination 02/04/2020, 02/29/2020    Counseled on the influenza virus vaccine.  Vaccine information sheet given.  Influenza vaccine given after consent obtained.  Counseled on the pneumococcal vaccine.  Vaccine information sheet given.  Pneumococcal vaccine Prevnar 20 given after consent obtained.  Consider updating Tdap tetanus booster at your pharmacy   Screening for cancer: Colon cancer screening: You decline screening at this time   Prostate  Cancer screening: The recommended prostate cancer screening test is a blood test called the prostate-specific antigen (PSA) test. PSA is a protein that is made in the prostate. As you age, your prostate naturally produces more PSA. Abnormally high PSA levels may be caused by: Prostate cancer. An enlarged prostate that is not caused by cancer (benign prostatic hyperplasia, or BPH). This condition is very common in older men. A prostate gland infection (prostatitis) or urinary tract infection. Certain medicines such as male hormones (like testosterone) or other medicines that raise testosterone levels. A rectal exam may be done as part of prostate cancer screening to help provide information about the size of your prostate gland. When a rectal exam is performed, it should be done after the PSA level is drawn to avoid any effect on the results.   Skin cancer screening: Check your skin regularly for new changes, growing lesions, or other lesions of concern Come in for evaluation if you have skin lesions of concern.   Lung cancer screening: If you have a greater than 20 pack year history of tobacco use, then you may qualify for lung cancer screening with a chest CT scan.   Please call your insurance company to inquire about coverage for this test.   Pancreatic cancer:  no current screening test is available or routinely recommended. (risk factors: smoking, overweight or obese, diabetes, chronic pancreatitis, work exposure - dry cleaning, metal working, 66yo>, M>F, Tree surgeon, family hx/o, hereditary breast, ovarian, melanoma, lynch, peutz-jeghers).  Symptoms: jaundice, dark urine, light color or greasy stools, itchy skin, belly or back pain, weight loss, poor appetite, nausea, vomiting, liver enlargement, DVT/blood clots.   We currently don't have screenings for other cancers besides breast, cervical, colon, and lung cancers.  If you have a strong family history of cancer or have other cancer  screening concerns, please let me know.  Genetic testing referral is an option for individuals with high cancer risk in the family.  There are some other cancer screenings in development currently.   Bone health: Get at least 150 minutes of aerobic exercise weekly Get weight bearing exercise at least once weekly Bone density test:  A bone density test is an imaging test that uses a type of X-ray to measure the amount of calcium and other minerals in your bones. The test may be used to diagnose or screen you for a condition that causes weak or thin bones (osteoporosis), predict your risk for a broken bone (fracture), or determine how well your osteoporosis treatment is working. The bone density test is recommended for females 65 and older, or females or males <65 if certain risk factors such as thyroid disease, long term use of steroids such as for asthma or rheumatological issues, vitamin D deficiency, estrogen deficiency, family history of osteoporosis, self or family history of fragility fracture in first degree relative.    Heart health: Get at least 150 minutes of aerobic exercise weekly Limit alcohol It is important to maintain a healthy blood pressure and healthy cholesterol numbers  Heart disease screening: Screening for heart disease includes screening for blood pressure, fasting lipids, glucose/diabetes screening, BMI height to weight ratio, reviewed of smoking status, physical activity, and diet.    Goals include blood pressure 120/80 or less, maintaining a healthy lipid/cholesterol profile, preventing diabetes or keeping diabetes numbers under good control, not smoking or using tobacco products, exercising most days per week or at least 150 minutes per week of exercise, and eating healthy variety of fruits and vegetables, healthy oils, and avoiding unhealthy food choices like fried food, fast food, high sugar and high cholesterol foods.    Other tests may possibly include EKG test,  CT coronary calcium score, echocardiogram, exercise treadmill stress test.     10/2022 echocardiogram reviewed, EF 55-60%, mild regurg mitral and tricuspid, indeterminate diastolic fillling pattern.   Vascular disease screening: For higher risk individuals including smokers, diabetics, patients with known heart disease or high blood pressure, kidney disease, and others, screening for vascular disease or atherosclerosis of the arteries is available.  Examples may include carotid ultrasound, abdominal aortic ultrasound, ABI blood flow screening in the legs, thoracic aorta screening.   Medical care options: I recommend you continue to seek care here first for routine care.  We try really hard to have available appointments Monday through Friday daytime hours for sick visits, acute visits, and physicals.  Urgent care should be used for after hours and weekends for significant issues that cannot wait till the next day.  The emergency department should be used for significant potentially life-threatening emergencies.  The emergency department is expensive, can often have long wait times for less significant concerns, so try to utilize primary care, urgent care, or telemedicine when possible to avoid unnecessary trips to the emergency department.  Virtual visits and telemedicine have been introduced since the pandemic started in 2020, and can be convenient ways to receive medical care.  We offer virtual appointments as well to assist you in a variety of options to seek medical care.   Legal  Take the time to do a last will and testament, Advanced Directives including Health Care Power of Attorney and Living Will documents.  Don't leave your family with burdens that can be handled ahead of  time.   Advanced Directives: I recommend you consider completing a Health Care Power of Attorney and Living Will.   These documents respect your wishes and help alleviate burdens on your loved ones if you were to become  terminally ill or be in a position to need those documents enforced.    You can complete Advanced Directives yourself, have them notarized, then have copies made for our office, for you and for anybody you feel should have them in safe keeping.  Or, you can have an attorney prepare these documents.   If you haven't updated your Last Will and Testament in a while, it may be worthwhile having an attorney prepare these documents together and save on some costs.       Spiritual and Emotional Health Keeping a healthy spiritual life can help you better manage your physical health. Your spiritual life can help you to cope with any issues that may arise with your physical health.  Balance can keep [place] healthy and help [place] to recover.  If you are struggling with your spiritual health there are questions that you may want to ask yourself:  What makes me feel most complete? When do I feel most connected to the rest of the world? Where do I find the most inner strength? What am I doing when I feel whole?  Helpful tips: Being in nature. Some people feel very connected and at peace when they are walking outdoors or are outside. Helping others. Some feel the largest sense of wellbeing when they are of service to others. Being of service can take on many forms. It can be doing volunteer work, being kind to strangers, or offering a hand to a friend in need. Gratitude. Some people find they feel the most connected when they remain grateful. They may make lists of all the things they are grateful for or say a thank you out loud for all they have.    Emotional Health Are you in tune with your emotional health?  Check out this link: http://www.marquez-love.com/    Financial Health Make sure you use a budget for your personal finances Make sure you are insured against risks (health insurance, life insurance, auto insurance, etc) Save more, spend less Set financial goals If you need help in this  area, good resources include counseling through [employer] or other community resources, have a meeting with a Social research officer, government, and a good resource is the [employer]

## 2023-08-27 ENCOUNTER — Other Ambulatory Visit: Payer: Self-pay | Admitting: Medical

## 2023-08-27 LAB — COMPREHENSIVE METABOLIC PANEL
ALT: 15 [IU]/L (ref 0–44)
AST: 26 IU/L (ref 0–40)
Albumin: 4.6 g/dL (ref 3.9–4.9)
Alkaline Phosphatase: 73 [IU]/L (ref 44–121)
BUN/Creatinine Ratio: 18 (ref 10–24)
BUN: 18 mg/dL (ref 8–27)
Bilirubin Total: 0.5 mg/dL (ref 0.0–1.2)
CO2: 24 mmol/L (ref 20–29)
Calcium: 9.4 mg/dL (ref 8.6–10.2)
Chloride: 100 mmol/L (ref 96–106)
Creatinine, Ser: 0.99 mg/dL (ref 0.76–1.27)
Globulin, Total: 3.1 g/dL (ref 1.5–4.5)
Glucose: 96 mg/dL (ref 70–99)
Potassium: 4.1 mmol/L (ref 3.5–5.2)
Sodium: 140 mmol/L (ref 134–144)
Total Protein: 7.7 g/dL (ref 6.0–8.5)
eGFR: 84 mL/min/{1.73_m2} (ref >59)

## 2023-08-27 LAB — CBC
Hematocrit: 47.3 % (ref 37.5–51.0)
Hemoglobin: 14.4 g/dL (ref 13.0–17.7)
MCH: 23.9 pg — ABNORMAL LOW (ref 26.6–33.0)
MCHC: 30.4 g/dL — ABNORMAL LOW (ref 31.5–35.7)
MCV: 78 fL — ABNORMAL LOW (ref 79–97)
Platelets: 185 10*3/uL (ref 150–450)
RBC: 6.03 x10E6/uL — ABNORMAL HIGH (ref 4.14–5.80)
RDW: 13.6 % (ref 11.6–15.4)
WBC: 6.5 10*3/uL (ref 3.4–10.8)

## 2023-08-27 LAB — PSA: Prostate Specific Ag, Serum: 1.5 ng/mL (ref 0.0–4.0)

## 2023-08-27 LAB — LIPID PANEL
Chol/HDL Ratio: 3.2 ratio (ref 0.0–5.0)
Cholesterol, Total: 133 mg/dL (ref 100–199)
HDL: 42 mg/dL (ref >39)
LDL Chol Calc (NIH): 58 mg/dL (ref 0–99)
Triglycerides: 201 mg/dL — ABNORMAL HIGH (ref 0–149)
VLDL Cholesterol Cal: 33 mg/dL (ref 5–40)

## 2023-08-27 MED ORDER — VALSARTAN-HYDROCHLOROTHIAZIDE 160-12.5 MG PO TABS: 1.0000 | ORAL_TABLET | Freq: Every day | ORAL | 3 refills | Status: DC

## 2023-08-27 MED ORDER — ROSUVASTATIN CALCIUM 10 MG PO TABS: 10.0000 mg | ORAL_TABLET | Freq: Every day | ORAL | 3 refills | Status: DC

## 2023-08-27 MED ORDER — OMEPRAZOLE 40 MG PO CPDR: 40.0000 mg | DELAYED_RELEASE_CAPSULE | Freq: Every day | ORAL | 3 refills | Status: DC

## 2023-08-27 NOTE — Progress Notes (Signed)
Labs overall okay.  Triglycerides are fats little high.  Red blood cells are stable.  Continue current medications.  Exercise regularly  Follow-up yearly for physical.  It was a pleasure to see you as always

## 2023-08-30 ENCOUNTER — Encounter: Payer: Self-pay | Admitting: Internal Medicine

## 2023-09-11 ENCOUNTER — Encounter: Payer: Self-pay | Admitting: Internal Medicine

## 2023-10-13 ENCOUNTER — Other Ambulatory Visit: Payer: Self-pay | Admitting: Internal Medicine

## 2024-08-15 ENCOUNTER — Other Ambulatory Visit: Payer: Self-pay | Admitting: Medical

## 2024-08-21 ENCOUNTER — Telehealth: Payer: Self-pay | Admitting: Internal Medicine

## 2024-08-21 ENCOUNTER — Ambulatory Visit: Admitting: Family Medicine

## 2024-08-21 ENCOUNTER — Other Ambulatory Visit: Payer: Self-pay | Admitting: Medical

## 2024-08-21 ENCOUNTER — Ambulatory Visit: Payer: Self-pay

## 2024-08-21 ENCOUNTER — Telehealth: Payer: Self-pay | Admitting: Medical

## 2024-08-21 VITALS — BP 118/86 | HR 79 | Temp 98.0°F | Wt 166.8 lb

## 2024-08-21 DIAGNOSIS — J069 Acute upper respiratory infection, unspecified: Secondary | ICD-10-CM

## 2024-08-21 MED ORDER — METHYLPREDNISOLONE 4 MG PO TBPK: ORAL_TABLET | ORAL | 0 refills | Status: DC

## 2024-08-21 NOTE — Progress Notes (Signed)
   Name: Garrett Henry   Date of Visit: 08/21/24   Date of last visit with me: Visit date not found   CHIEF COMPLAINT:  Chief Complaint  Patient presents with   Acute Visit    Cough.        HPI:  Discussed the use of AI scribe software for clinical note transcription with the patient, who gave verbal consent to proceed.  History of Present Illness Garrett Henry is a 68 year old male who presents with cough and fever.  He has been experiencing a non-productive cough and mild fever for the past three days, which he believes he contracted from his wife. The fever causes a sensation of warmth in his shoulders and back.  He also experiences muscle soreness when the fever is present. There has been no change in symptoms over the past three days.     OBJECTIVE:       08/26/2023    8:23 AM  Depression screen PHQ 2/9  Decreased Interest 0  Down, Depressed, Hopeless 0  PHQ - 2 Score 0     BP Readings from Last 3 Encounters:  08/21/24 118/86  08/26/23 122/78  11/02/22 116/60    BP 118/86   Pulse 79   Temp 98 F (36.7 C)   Wt 166 lb 12.8 oz (75.7 kg)   SpO2 98%   BMI 27.76 kg/m    Physical Exam    Physical Exam Constitutional:      Appearance: Normal appearance.  Neurological:     Mental Status: He is alert.     ASSESSMENT/PLAN:   Assessment & Plan Viral URI with cough    Assessment and Plan Assessment & Plan Acute cough and fever, suspected viral etiology Cough and fever for three days, likely viral. Differential includes influenza or COVID-19. Bacterial infection unlikely. - Perform swab test for influenza and COVID-19. - Discussed with patient symptoms and patient would like to go ahead at this time with a medrol dose pack.      Markeria Goetsch A. Vita MD [facility]

## 2024-08-21 NOTE — Telephone Encounter (Signed)
 Left detailed message for pt that medication was sent in

## 2024-08-21 NOTE — Telephone Encounter (Signed)
   This was just sent in 15 mins ago and it was confirmed by pharmacy.

## 2024-08-21 NOTE — Telephone Encounter (Signed)
 FYI Only or Action Required?: FYI only for provider: appointment scheduled on today.  Patient was last seen in primary care on 08/26/2023 by Bulah Alm RAMAN, PA-C.  Called Nurse Triage reporting Cough.LGF - history of lung surgery  Symptoms began several days ago.  Interventions attempted: Nothing.  Symptoms are: gradually worsening.  Triage Disposition: See PCP When Office is Open (Within 3 Days)  Patient/caregiver understands and will follow disposition?: yes                   Copied from CRM #[id number]. Topic: Clinical - Red Word Triage >> Aug 21, 2024  8:34 AM Garrett Henry wrote: Red Word that prompted transfer to Nurse Triage: Coughing and fever. Increased mucus. Reason for Disposition  Cough has been present for > 3 weeks  Answer Assessment - Initial Assessment Questions 1. ONSET: When did the cough begin?      3 days 2. SEVERITY: How bad is the cough today?      moderate 3. SPUTUM: Describe the color of your sputum (e.g., none, dry cough; clear, white, yellow, green)     Clear - said 4. HEMOPTYSIS: Are you coughing up any blood? If Yes, ask: How much? (e.g., flecks, streaks, tablespoons, etc.)     no 5. DIFFICULTY BREATHING: Are you having difficulty breathing? If Yes, ask: How bad is it? (e.g., mild, moderate, severe)      no 6. FEVER: Do you have a fever? If Yes, ask: What is your temperature, how was it measured, and when did it start?     Has not taken his temperature 7. CARDIAC HISTORY: Do you have any history of heart disease? (e.g., heart attack, congestive heart failure)      no 8. LUNG HISTORY: Do you have any history of lung disease?  (e.g., pulmonary embolus, asthma, emphysema)     Lung sx  10. OTHER SYMPTOMS: Do you have any other symptoms? (e.g., runny nose, wheezing, chest pain)       Cough  Protocols used: Cough - Acute Productive-A-AH

## 2024-08-21 NOTE — Telephone Encounter (Signed)
  Dr. Vita saw patient today but no medication was sent in but he noted that he would send in medrol dose pack. Please advise   Copied from CRM [id number]. Topic: Clinical - Prescription Issue >> Aug 21, 2024 12:58 PM Garrett Henry wrote: Pt is calling to see when his prescription will be ready

## 2024-08-21 NOTE — Telephone Encounter (Signed)
 Copied from CRM [id number]. Topic: Clinical - Prescription Issue >> Aug 21, 2024  2:58 PM [name] wrote: Reason for CRM: Patient states he was in the office for a visit today and was suppose to receive a medrol dose pack. Pharmacy stated they do not have anything.   Callback #: [id number]  Pharmacy:  [id number] [place], [place] - [address] [place] [address] Phone: [phone] Fax: [phone] Hours: Not open 24 hours

## 2024-08-31 ENCOUNTER — Encounter: Payer: Self-pay | Admitting: Medical

## 2024-08-31 ENCOUNTER — Ambulatory Visit: Payer: Medicare Other | Admitting: Medical

## 2024-08-31 VITALS — BP 118/68 | HR 64 | Ht 65.0 in | Wt 165.2 lb

## 2024-08-31 DIAGNOSIS — Z125 Encounter for screening for malignant neoplasm of prostate: Secondary | ICD-10-CM | POA: Diagnosis not present

## 2024-08-31 DIAGNOSIS — Z1211 Encounter for screening for malignant neoplasm of colon: Secondary | ICD-10-CM | POA: Diagnosis not present

## 2024-08-31 DIAGNOSIS — N529 Male erectile dysfunction, unspecified: Secondary | ICD-10-CM | POA: Diagnosis not present

## 2024-08-31 DIAGNOSIS — Z7185 Encounter for immunization safety counseling: Secondary | ICD-10-CM

## 2024-08-31 DIAGNOSIS — I10 Essential (primary) hypertension: Secondary | ICD-10-CM | POA: Diagnosis not present

## 2024-08-31 DIAGNOSIS — K036 Deposits [accretions] on teeth: Secondary | ICD-10-CM | POA: Diagnosis not present

## 2024-08-31 DIAGNOSIS — K219 Gastro-esophageal reflux disease without esophagitis: Secondary | ICD-10-CM | POA: Diagnosis not present

## 2024-08-31 DIAGNOSIS — R3914 Feeling of incomplete bladder emptying: Secondary | ICD-10-CM | POA: Diagnosis not present

## 2024-08-31 DIAGNOSIS — Z1389 Encounter for screening for other disorder: Secondary | ICD-10-CM

## 2024-08-31 DIAGNOSIS — N401 Enlarged prostate with lower urinary tract symptoms: Secondary | ICD-10-CM

## 2024-08-31 DIAGNOSIS — I700 Atherosclerosis of aorta: Secondary | ICD-10-CM | POA: Diagnosis not present

## 2024-08-31 DIAGNOSIS — H269 Unspecified cataract: Secondary | ICD-10-CM

## 2024-08-31 DIAGNOSIS — Z23 Encounter for immunization: Secondary | ICD-10-CM | POA: Diagnosis not present

## 2024-08-31 DIAGNOSIS — Z0000 Encounter for general adult medical examination without abnormal findings: Secondary | ICD-10-CM

## 2024-08-31 LAB — LIPID PANEL

## 2024-08-31 NOTE — Patient Instructions (Signed)
 This visit was a preventative care visit, also known as wellness visit or routine physical.   Topics typically include healthy lifestyle, diet, exercise, preventative care, vaccinations, sick and well care, proper use of emergency dept and after hours care, as well as other concerns.     Separate significant issues discussed: GERD - you can use over the counter Pepcid  or over the counter Restora probiotic.  Limit or avoid spicy foods, hot sauce, citrus and tomato-based foods.  Hypertension-Continue valsartan  HCT 160/12.5 mg daily   Hyperlipidemia-continue rosuvastatin  10 mg daily  Enlarged prostate symptoms, BPH -begin trial of Cialis 5mg  daily -if this is not covered by insurance, we can try Flomax  prescription, but let me know if insurance not covering Cialis  Erectile dysfunction -begin trial of Cialis 5mg  daily for both erectile dysfunction, urinary symptoms and enlarged prostate  Cataract -see your eye doctor yearly  Dental plaque and stain -see your dentist twice yearly for cleaning and routine care   General Recommendations: Continue to return yearly for your annual wellness and preventative care visits.  This gives us  a chance to discuss healthy lifestyle, exercise, vaccinations, review your chart record, and perform screenings where appropriate.  I recommend you see your eye doctor yearly for routine vision care.  I recommend you see your dentist yearly for routine dental care including hygiene visits twice yearly.   Vaccination  Immunization History  Administered Date(s) Administered   Fluad Quad(high Dose 65+) 08/20/2022   Fluad Trivalent(High Dose 65+) 08/26/2023   INFLUENZA, HIGH DOSE SEASONAL PF 08/31/2024   Influenza,inj,Quad PF,6+ Mos 07/02/2017, 08/03/2018, 06/19/2021   Influenza-Unspecified 09/21/2016   PFIZER(Purple Top)SARS-COV-2 Vaccination 02/04/2020, 02/29/2020   PNEUMOCOCCAL CONJUGATE-20 08/26/2023    Counseled on the influenza virus vaccine.   Vaccine information sheet given.  Influenza vaccine given after consent obtained.  Counseled on the pneumococcal vaccine.  Vaccine information sheet given.  Pneumococcal vaccine Prevnar 20 given after consent obtained.  Consider updating Tdap tetanus booster at your pharmacy   Screening for cancer: Colon cancer screening: You decline screening at this time   Prostate Cancer screening: The recommended prostate cancer screening test is a blood test called the prostate-specific antigen (PSA) test. PSA is a protein that is made in the prostate. As you age, your prostate naturally produces more PSA. Abnormally high PSA levels may be caused by: Prostate cancer. An enlarged prostate that is not caused by cancer (benign prostatic hyperplasia, or BPH). This condition is very common in older men. A prostate gland infection (prostatitis) or urinary tract infection. Certain medicines such as male hormones (like testosterone) or other medicines that raise testosterone levels. A rectal exam may be done as part of prostate cancer screening to help provide information about the size of your prostate gland. When a rectal exam is performed, it should be done after the PSA level is drawn to avoid any effect on the results.   Skin cancer screening: Check your skin regularly for new changes, growing lesions, or other lesions of concern Come in for evaluation if you have skin lesions of concern.   Lung cancer screening: If you have a greater than 20 pack year history of tobacco use, then you may qualify for lung cancer screening with a chest CT scan.   Please call your insurance company to inquire about coverage for this test.   Pancreatic cancer:  no current screening test is available or routinely recommended. (risk factors: smoking, overweight or obese, diabetes, chronic pancreatitis, work exposure - dry cleaning, metal  working, 68yo>, M>F, African American, family hx/o, hereditary breast, ovarian,  melanoma, lynch, peutz-jeghers).  Symptoms: jaundice, dark urine, light color or greasy stools, itchy skin, belly or back pain, weight loss, poor appetite, nausea, vomiting, liver enlargement, DVT/blood clots.   We currently don't have screenings for other cancers besides breast, cervical, colon, and lung cancers.  If you have a strong family history of cancer or have other cancer screening concerns, please let me know.  Genetic testing referral is an option for individuals with high cancer risk in the family.  There are some other cancer screenings in development currently.   Bone health: Get at least 150 minutes of aerobic exercise weekly Get weight bearing exercise at least once weekly Bone density test:  A bone density test is an imaging test that uses a type of X-ray to measure the amount of calcium  and other minerals in your bones. The test may be used to diagnose or screen you for a condition that causes weak or thin bones (osteoporosis), predict your risk for a broken bone (fracture), or determine how well your osteoporosis treatment is working. The bone density test is recommended for females 65 and older, or females or males <65 if certain risk factors such as thyroid disease, long term use of steroids such as for asthma or rheumatological issues, vitamin D deficiency, estrogen deficiency, family history of osteoporosis, self or family history of fragility fracture in first degree relative.    Heart health: Get at least 150 minutes of aerobic exercise weekly Limit alcohol It is important to maintain a healthy blood pressure and healthy cholesterol numbers  Heart disease screening: Screening for heart disease includes screening for blood pressure, fasting lipids, glucose/diabetes screening, BMI height to weight ratio, reviewed of smoking status, physical activity, and diet.    Goals include blood pressure 120/80 or less, maintaining a healthy lipid/cholesterol profile, preventing  diabetes or keeping diabetes numbers under good control, not smoking or using tobacco products, exercising most days per week or at least 150 minutes per week of exercise, and eating healthy variety of fruits and vegetables, healthy oils, and avoiding unhealthy food choices like fried food, fast food, high sugar and high cholesterol foods.    Other tests may possibly include EKG test, CT coronary calcium  score, echocardiogram, exercise treadmill stress test.     10/2022 echocardiogram reviewed, EF 55-60%, mild regurg mitral and tricuspid, indeterminate diastolic filling pattern.   Vascular disease screening: For higher risk individuals including smokers, diabetics, patients with known heart disease or high blood pressure, kidney disease, and others, screening for vascular disease or atherosclerosis of the arteries is available.  Examples may include carotid ultrasound, abdominal aortic ultrasound, ABI blood flow screening in the legs, thoracic aorta screening.   Medical care options: I recommend you continue to seek care here first for routine care.  We try really hard to have available appointments Monday through Friday daytime hours for sick visits, acute visits, and physicals.  Urgent care should be used for after hours and weekends for significant issues that cannot wait till the next day.  The emergency department should be used for significant potentially life-threatening emergencies.  The emergency department is expensive, can often have long wait times for less significant concerns, so try to utilize primary care, urgent care, or telemedicine when possible to avoid unnecessary trips to the emergency department.  Virtual visits and telemedicine have been introduced since the pandemic started in 2020, and can be convenient ways to receive medical care.  We offer virtual appointments as well to assist you in a variety of options to seek medical care.   Legal  Take the time to do a last will and  testament, Advanced Directives including Health Care Power of Attorney and Living Will documents.  Don't leave your family with burdens that can be handled ahead of time.   Advanced Directives: I recommend you consider completing a Health Care Power of Attorney and Living Will.   These documents respect your wishes and help alleviate burdens on your loved ones if you were to become terminally ill or be in a position to need those documents enforced.    You can complete Advanced Directives yourself, have them notarized, then have copies made for our office, for you and for anybody you feel should have them in safe keeping.  Or, you can have an attorney prepare these documents.   If you haven't updated your Last Will and Testament in a while, it may be worthwhile having an attorney prepare these documents together and save on some costs.       Spiritual and Emotional Health Keeping a healthy spiritual life can help you better manage your physical health. Your spiritual life can help you to cope with any issues that may arise with your physical health.  Balance can keep us  healthy and help us  to recover.  If you are struggling with your spiritual health there are questions that you may want to ask yourself:  What makes me feel most complete? When do I feel most connected to the rest of the world? Where do I find the most inner strength? What am I doing when I feel whole?  Helpful tips: Being in nature. Some people feel very connected and at peace when they are walking outdoors or are outside. Helping others. Some feel the largest sense of wellbeing when they are of service to others. Being of service can take on many forms. It can be doing volunteer work, being kind to strangers, or offering a hand to a friend in need. Gratitude. Some people find they feel the most connected when they remain grateful. They may make lists of all the things they are grateful for or say a thank you out loud for all  they have.    Emotional Health Are you in tune with your emotional health?  Check out this link: Http://www.marquez-love.com/    Financial Health Make sure you use a budget for your personal finances Make sure you are insured against risks (health insurance, life insurance, auto insurance, etc) Save more, spend less Set financial goals If you need help in this area, good resources include counseling through [employer] or other community resources, have a meeting with a social research officer, government, and a good resource is the [employer]

## 2024-08-31 NOTE — Progress Notes (Signed)
 Subjective:   HPI  Garrett Henry is a 68 y.o. male who presents for Chief Complaint  Patient presents with   Annual Exam    Fasting cpe, would like HD flu shot today    Patient Care Team: Kamsiyochukwu Spickler, Alm GORMAN RIGGERS as PCP - General (Family Medicine) Dr. Gordy Starch, GI Dr. Tonnie Flicker, pulmonology Dr. Evalene Nam, urology Dr. Annalee Casa, cardiology   Concerns: Garrett Henry is a 68 year old male who presents for an annual physical exam.  He experienced a cough and fever last week, which have since resolved, but he continues to have voice loss after talking for about twenty minutes. He took over-the-counter cough medicine every six hours last week.  He has a history of cataracts and opted for glasses instead of surgery. His vision is satisfactory with glasses.  He is currently taking valsartan  HCT 160/12.5 mg daily for blood pressure and Crestor  10 mg daily for cholesterol. He stopped taking medication for stomach reflux (omeprazole ) as it was ineffective and continues to experience symptoms.  He has a history of smoking, which he quit in 1995 after lung surgery. He cannot tolerate the smell of smoke now.  He exercises regularly, engaging in physical activity for about two hours, although he notes some difficulty with his 'front ear' being 'a little bit big'.  He reports issues with erectile dysfunction and urinary symptoms, including difficulty with erections and urinary leakage. He experiences nocturia once or twice a night and has to strain to initiate urination. He has tried Viagra in the past.    Reviewed their medical, surgical, family, social, medication, and allergy history and updated chart as appropriate.  Allergies  Allergen Reactions   Amoxicillin Other (See Comments)    Pressure in face   Lisinopril      cough    Past Medical History:  Diagnosis Date   Adenomatous colon polyp    Allergy    Diverticulosis    GERD (gastroesophageal reflux disease)     Hyperlipidemia    Hypertension    Internal hemorrhoids    Kidney stone    PNA (pneumonia)    Wears glasses     Current Outpatient Medications on File Prior to Visit  Medication Sig Dispense Refill   rosuvastatin  (CRESTOR ) 10 MG tablet Take 1 tablet (10 mg total) by mouth daily. 90 tablet 3   valsartan -hydrochlorothiazide  (DIOVAN -HCT) 160-12.5 MG tablet TAKE 1 TABLET BY MOUTH EVERY DAY 90 tablet 0   No current facility-administered medications on file prior to visit.      Current Outpatient Medications:    rosuvastatin  (CRESTOR ) 10 MG tablet, Take 1 tablet (10 mg total) by mouth daily., Disp: 90 tablet, Rfl: 3   valsartan -hydrochlorothiazide  (DIOVAN -HCT) 160-12.5 MG tablet, TAKE 1 TABLET BY MOUTH EVERY DAY, Disp: 90 tablet, Rfl: 0  Family History  Problem Relation Age of Onset   Stroke Father    Colon cancer Neg Hx    Rectal cancer Neg Hx    Stomach cancer Neg Hx     Past Surgical History:  Procedure Laterality Date   LUNG REMOVAL, PARTIAL Left 1995    Review of Systems  Constitutional:  Negative for chills, fever, malaise/fatigue and weight loss.  HENT:  Negative for congestion, ear pain, hearing loss, sore throat and tinnitus.   Eyes:  Negative for blurred vision, pain and redness.  Respiratory:  Positive for cough. Negative for hemoptysis and shortness of breath.   Cardiovascular:  Negative for chest pain, palpitations, orthopnea, claudication and  leg swelling.  Gastrointestinal:  Positive for heartburn. Negative for abdominal pain, blood in stool, constipation, diarrhea, nausea and vomiting.  Genitourinary:  Negative for dysuria, flank pain, frequency, hematuria and urgency.  Musculoskeletal:  Negative for falls, joint pain and myalgias.  Skin:  Negative for itching and rash.  Neurological:  Negative for dizziness, tingling, speech change, weakness and headaches.  Endo/Heme/Allergies:  Negative for polydipsia. Does not bruise/bleed easily.  Psychiatric/Behavioral:   Negative for depression and memory loss. The patient is not nervous/anxious and does not have insomnia.      Objective:  BP 118/68   Pulse 64   Ht 5' 5 (1.651 m)   Wt 165 lb 3.2 oz (74.9 kg)   BMI 27.49 kg/m   General appearance: alert, no distress, WD/WN, Asian male, occasional cough Skin: faded tattoo left anterior forearm, otherwise skin unremarkable HEENT: normocephalic, conjunctiva/corneas normal, sclerae anicteric, PERRLA, EOMi, nares patent, no discharge or erythema, pharynx normal Oral cavity: MMM, tongue normal, teeth - missing several molars, lots of plaque and stain Neck: supple, no lymphadenopathy, no thyromegaly, no masses, normal ROM, no bruits Chest: non tender, normal shape and expansion Heart: RRR, normal S1, S2, no murmurs Lungs: CTA bilaterally, no wheezes, rhonchi, or rales Abdomen: +bs, soft, non tender, non distended, no masses, no hepatomegaly, no splenomegaly, no bruits Back: non tender, normal ROM, no scoliosis Musculoskeletal: upper extremities non tender, no obvious deformity, normal ROM throughout, lower extremities non tender, no obvious deformity, normal ROM throughout Extremities: no edema, no cyanosis, no clubbing Pulses: 2+ symmetric, upper and lower extremities, normal cap refill Neurological: alert, oriented x 3, CN2-12 intact, strength normal upper extremities and lower extremities, sensation normal throughout, DTRs 2+ throughout, no cerebellar signs, gait normal Psychiatric: normal affect, behavior normal, pleasant  GU:/rectal - declined/deferred   Assessment and Plan :   Encounter Diagnoses  Name Primary?   Encounter for health maintenance examination in adult Yes   Needs flu shot    Essential hypertension, benign    Gastroesophageal reflux disease, unspecified whether esophagitis present    Aortic atherosclerosis    Erectile dysfunction, unspecified erectile dysfunction type    Benign prostatic hyperplasia with incomplete bladder  emptying    Cataract, unspecified cataract type, unspecified laterality    Dental plaque    Screening for colon cancer    Screening for prostate cancer    Vaccine counseling    Screening for hematuria or proteinuria     This visit was a preventative care visit, also known as wellness visit or routine physical.   Topics typically include healthy lifestyle, diet, exercise, preventative care, vaccinations, sick and well care, proper use of emergency dept and after hours care, as well as other concerns.     Separate significant issues discussed: GERD - you can use over the counter Pepcid  or over the counter Restora probiotic.  Limit or avoid spicy foods, hot sauce, citrus and tomato-based foods.  Hypertension-Continue valsartan  HCT 160/12.5 mg daily   Hyperlipidemia-continue rosuvastatin  10 mg daily  Enlarged prostate symptoms, BPH -begin trial of Cialis 5mg  daily -if this is not covered by insurance, we can try Flomax  prescription, but let me know if insurance not covering Cialis  Erectile dysfunction -begin trial of Cialis 5mg  daily for both erectile dysfunction, urinary symptoms and enlarged prostate  Cataract -see your eye doctor yearly  Dental plaque and stain -see your dentist twice yearly for cleaning and routine care   General Recommendations: Continue to return yearly for your annual wellness and  preventative care visits.  This gives us  a chance to discuss healthy lifestyle, exercise, vaccinations, review your chart record, and perform screenings where appropriate.  I recommend you see your eye doctor yearly for routine vision care.  I recommend you see your dentist yearly for routine dental care including hygiene visits twice yearly.   Vaccination  Immunization History  Administered Date(s) Administered   Fluad Quad(high Dose 65+) 08/20/2022   Fluad Trivalent(High Dose 65+) 08/26/2023   INFLUENZA, HIGH DOSE SEASONAL PF 08/31/2024   Influenza,inj,Quad PF,6+ Mos  07/02/2017, 08/03/2018, 06/19/2021   Influenza-Unspecified 09/21/2016   PFIZER(Purple Top)SARS-COV-2 Vaccination 02/04/2020, 02/29/2020   PNEUMOCOCCAL CONJUGATE-20 08/26/2023    Counseled on the influenza virus vaccine. Influenza vaccine given after consent obtained.   Screening for cancer: Colon cancer screening: You decline screening at this time   Prostate Cancer screening: The recommended prostate cancer screening test is a blood test called the prostate-specific antigen (PSA) test. PSA is a protein that is made in the prostate. As you age, your prostate naturally produces more PSA. Abnormally high PSA levels may be caused by: Prostate cancer. An enlarged prostate that is not caused by cancer (benign prostatic hyperplasia, or BPH). This condition is very common in older men. A prostate gland infection (prostatitis) or urinary tract infection. Certain medicines such as male hormones (like testosterone) or other medicines that raise testosterone levels. A rectal exam may be done as part of prostate cancer screening to help provide information about the size of your prostate gland. When a rectal exam is performed, it should be done after the PSA level is drawn to avoid any effect on the results.   Skin cancer screening: Check your skin regularly for new changes, growing lesions, or other lesions of concern Come in for evaluation if you have skin lesions of concern.   Lung cancer screening: If you have a greater than 20 pack year history of tobacco use, then you may qualify for lung cancer screening with a chest CT scan.   Please call your insurance company to inquire about coverage for this test.   Pancreatic cancer:  no current screening test is available or routinely recommended. (risk factors: smoking, overweight or obese, diabetes, chronic pancreatitis, work exposure - dry cleaning, metal working, 68yo>, M>F, Tree Surgeon, family hx/o, hereditary breast, ovarian, melanoma,  lynch, peutz-jeghers).  Symptoms: jaundice, dark urine, light color or greasy stools, itchy skin, belly or back pain, weight loss, poor appetite, nausea, vomiting, liver enlargement, DVT/blood clots.   We currently don't have screenings for other cancers besides breast, cervical, colon, and lung cancers.  If you have a strong family history of cancer or have other cancer screening concerns, please let me know.  Genetic testing referral is an option for individuals with high cancer risk in the family.  There are some other cancer screenings in development currently.   Bone health: Get at least 150 minutes of aerobic exercise weekly Get weight bearing exercise at least once weekly Bone density test:  A bone density test is an imaging test that uses a type of X-ray to measure the amount of calcium  and other minerals in your bones. The test may be used to diagnose or screen you for a condition that causes weak or thin bones (osteoporosis), predict your risk for a broken bone (fracture), or determine how well your osteoporosis treatment is working. The bone density test is recommended for females 65 and older, or females or males <65 if certain risk factors such as thyroid  disease, long term use of steroids such as for asthma or rheumatological issues, vitamin D deficiency, estrogen deficiency, family history of osteoporosis, self or family history of fragility fracture in first degree relative.    Heart health: Get at least 150 minutes of aerobic exercise weekly Limit alcohol It is important to maintain a healthy blood pressure and healthy cholesterol numbers  Heart disease screening: Screening for heart disease includes screening for blood pressure, fasting lipids, glucose/diabetes screening, BMI height to weight ratio, reviewed of smoking status, physical activity, and diet.    Goals include blood pressure 120/80 or less, maintaining a healthy lipid/cholesterol profile, preventing diabetes or  keeping diabetes numbers under good control, not smoking or using tobacco products, exercising most days per week or at least 150 minutes per week of exercise, and eating healthy variety of fruits and vegetables, healthy oils, and avoiding unhealthy food choices like fried food, fast food, high sugar and high cholesterol foods.    Other tests may possibly include EKG test, CT coronary calcium  score, echocardiogram, exercise treadmill stress test.     10/2022 echocardiogram reviewed, EF 55-60%, mild regurg mitral and tricuspid, indeterminate diastolic filling pattern.   Vascular disease screening: For higher risk individuals including smokers, diabetics, patients with known heart disease or high blood pressure, kidney disease, and others, screening for vascular disease or atherosclerosis of the arteries is available.  Examples may include carotid ultrasound, abdominal aortic ultrasound, ABI blood flow screening in the legs, thoracic aorta screening.   Medical care options: I recommend you continue to seek care here first for routine care.  We try really hard to have available appointments Monday through Friday daytime hours for sick visits, acute visits, and physicals.  Urgent care should be used for after hours and weekends for significant issues that cannot wait till the next day.  The emergency department should be used for significant potentially life-threatening emergencies.  The emergency department is expensive, can often have long wait times for less significant concerns, so try to utilize primary care, urgent care, or telemedicine when possible to avoid unnecessary trips to the emergency department.  Virtual visits and telemedicine have been introduced since the pandemic started in 2020, and can be convenient ways to receive medical care.  We offer virtual appointments as well to assist you in a variety of options to seek medical care.   Legal  Take the time to do a last will and testament,  Advanced Directives including Health Care Power of Attorney and Living Will documents.  Don't leave your family with burdens that can be handled ahead of time.   Advanced Directives: I recommend you consider completing a Health Care Power of Attorney and Living Will.   These documents respect your wishes and help alleviate burdens on your loved ones if you were to become terminally ill or be in a position to need those documents enforced.    You can complete Advanced Directives yourself, have them notarized, then have copies made for our office, for you and for anybody you feel should have them in safe keeping.  Or, you can have an attorney prepare these documents.   If you haven't updated your Last Will and Testament in a while, it may be worthwhile having an attorney prepare these documents together and save on some costs.       Spiritual and Emotional Health Keeping a healthy spiritual life can help you better manage your physical health. Your spiritual life can help you to cope with any  issues that may arise with your physical health.  Balance can keep us  healthy and help us  to recover.  If you are struggling with your spiritual health there are questions that you may want to ask yourself:  What makes me feel most complete? When do I feel most connected to the rest of the world? Where do I find the most inner strength? What am I doing when I feel whole?  Helpful tips: Being in nature. Some people feel very connected and at peace when they are walking outdoors or are outside. Helping others. Some feel the largest sense of wellbeing when they are of service to others. Being of service can take on many forms. It can be doing volunteer work, being kind to strangers, or offering a hand to a friend in need. Gratitude. Some people find they feel the most connected when they remain grateful. They may make lists of all the things they are grateful for or say a thank you out loud for all they  have.    Emotional Health Are you in tune with your emotional health?  Check out this link: Http://www.marquez-love.com/    Financial Health Make sure you use a budget for your personal finances Make sure you are insured against risks (health insurance, life insurance, auto insurance, etc) Save more, spend less Set financial goals If you need help in this area, good resources include counseling through [employer] or other community resources, have a meeting with a social research officer, government, and a good resource is the [employer] was seen today for annual exam.  Diagnoses and all orders for this visit:  Encounter for health maintenance examination in adult -     CBC with Differential/Platelet -     Comprehensive metabolic panel with GFR -     Lipid panel -     PSA -     Urinalysis, Routine w reflex microscopic  Needs flu shot -     Flu vaccine HIGH DOSE PF(Fluzone Trivalent)  Essential hypertension, benign  Gastroesophageal reflux disease, unspecified whether esophagitis present  Aortic atherosclerosis -     Lipid panel  Erectile dysfunction, unspecified erectile dysfunction type  Benign prostatic hyperplasia with incomplete bladder emptying -     PSA  Cataract, unspecified cataract type, unspecified laterality  Dental plaque  Screening for colon cancer  Screening for prostate cancer -     PSA  Vaccine counseling  Screening for hematuria or proteinuria -     Urinalysis, Routine w reflex microscopic     Follow-up pending labs, yearly for physical

## 2024-09-01 ENCOUNTER — Ambulatory Visit: Payer: Self-pay | Admitting: Medical

## 2024-09-01 ENCOUNTER — Other Ambulatory Visit: Payer: Self-pay | Admitting: Medical

## 2024-09-01 LAB — URINALYSIS, ROUTINE W REFLEX MICROSCOPIC
Bilirubin, UA: NEGATIVE
Glucose, UA: NEGATIVE
Ketones, UA: NEGATIVE
Leukocytes,UA: NEGATIVE
Nitrite, UA: NEGATIVE
Protein,UA: NEGATIVE
RBC, UA: NEGATIVE
Specific Gravity, UA: 1.02 (ref 1.005–1.030)
Urobilinogen, Ur: 0.2 mg/dL (ref 0.2–1.0)
pH, UA: 5.5 (ref 5.0–7.5)

## 2024-09-01 LAB — COMPREHENSIVE METABOLIC PANEL WITH GFR
ALT: 34 IU/L (ref 0–44)
AST: 40 IU/L (ref 0–40)
Albumin: 4.4 g/dL (ref 3.9–4.9)
Alkaline Phosphatase: 82 IU/L (ref 47–123)
BUN/Creatinine Ratio: 20 (ref 10–24)
BUN: 19 mg/dL (ref 8–27)
Bilirubin Total: 0.4 mg/dL (ref 0.0–1.2)
CO2: 25 mmol/L (ref 20–29)
Calcium: 9.2 mg/dL (ref 8.6–10.2)
Chloride: 99 mmol/L (ref 96–106)
Creatinine, Ser: 0.97 mg/dL (ref 0.76–1.27)
Globulin, Total: 2.6 g/dL (ref 1.5–4.5)
Glucose: 104 mg/dL — AB (ref 70–99)
Potassium: 4 mmol/L (ref 3.5–5.2)
Sodium: 140 mmol/L (ref 134–144)
Total Protein: 7 g/dL (ref 6.0–8.5)
eGFR: 86 mL/min/1.73 (ref >59)

## 2024-09-01 LAB — CBC WITH DIFFERENTIAL/PLATELET
Basophils Absolute: 0 x10E3/uL (ref 0.0–0.2)
Basos: 0 %
EOS (ABSOLUTE): 0.1 x10E3/uL (ref 0.0–0.4)
Eos: 1 %
Hematocrit: 46.6 % (ref 37.5–51.0)
Hemoglobin: 14.5 g/dL (ref 13.0–17.7)
Immature Grans (Abs): 0.1 x10E3/uL (ref 0.0–0.1)
Immature Granulocytes: 1 %
Lymphocytes Absolute: 2.5 x10E3/uL (ref 0.7–3.1)
Lymphs: 24 %
MCH: 24.5 pg — ABNORMAL LOW (ref 26.6–33.0)
MCHC: 31.1 g/dL — ABNORMAL LOW (ref 31.5–35.7)
MCV: 79 fL (ref 79–97)
Monocytes Absolute: 1 x10E3/uL — ABNORMAL HIGH (ref 0.1–0.9)
Monocytes: 10 %
Neutrophils Absolute: 6.6 x10E3/uL (ref 1.4–7.0)
Neutrophils: 64 %
Platelets: 190 x10E3/uL (ref 150–450)
RBC: 5.93 x10E6/uL — ABNORMAL HIGH (ref 4.14–5.80)
RDW: 13.8 % (ref 11.6–15.4)
WBC: 10.3 x10E3/uL (ref 3.4–10.8)

## 2024-09-01 LAB — LIPID PANEL
Cholesterol, Total: 115 mg/dL (ref 100–199)
HDL: 50 mg/dL (ref >39)
LDL CALC COMMENT:: 2.3 ratio (ref 0.0–5.0)
LDL Chol Calc (NIH): 47 mg/dL (ref 0–99)
Triglycerides: 94 mg/dL (ref 0–149)
VLDL Cholesterol Cal: 18 mg/dL (ref 5–40)

## 2024-09-01 LAB — PSA: Prostate Specific Ag, Serum: 2 ng/mL (ref 0.0–4.0)

## 2024-09-01 MED ORDER — ROSUVASTATIN CALCIUM 10 MG PO TABS: 10.0000 mg | ORAL_TABLET | Freq: Every day | ORAL | 3 refills | Status: AC

## 2024-09-01 MED ORDER — VALSARTAN-HYDROCHLOROTHIAZIDE 160-12.5 MG PO TABS: 1.0000 | ORAL_TABLET | Freq: Every day | ORAL | 3 refills | Status: AC

## 2024-09-01 NOTE — Progress Notes (Signed)
 Labs show normal prostate marker, normal cholesterol, normal liver kidney and electrolytes, blood counts shows slightly elevated red cells but this is chronic for you and stable.  Continue your current medications  Review over your after visit summary from yesterday.  Try to see your dentist and eye doctor every year  Glad you are doing well!

## 2025-07-26 ENCOUNTER — Encounter: Admitting: Medical
